# Patient Record
Sex: Female | Born: 1990 | Race: White | Hispanic: No | Marital: Married | Smoking: Never smoker
Health system: Southern US, Community
[De-identification: ages and names within clinical notes are randomized; demographics above are authoritative.]

## PROBLEM LIST (undated history)

## (undated) ENCOUNTER — Inpatient Hospital Stay (HOSPITAL_COMMUNITY): Payer: Self-pay

## (undated) DIAGNOSIS — Z789 Other specified health status: Secondary | ICD-10-CM

## (undated) HISTORY — PX: NO PAST SURGERIES: SHX2092

## (undated) HISTORY — DX: Other specified health status: Z78.9

---

## 2011-04-11 ENCOUNTER — Emergency Department (HOSPITAL_COMMUNITY)
Admission: EM | Admit: 2011-04-11 | Discharge: 2011-04-11 | Disposition: A | Discharge status: Home / Self Care | Payer: BC Managed Care – PPO | Attending: Emergency Medicine | Admitting: Emergency Medicine

## 2011-04-11 DIAGNOSIS — H5710 Ocular pain, unspecified eye: Secondary | ICD-10-CM | POA: Insufficient documentation

## 2011-04-11 DIAGNOSIS — H18829 Corneal disorder due to contact lens, unspecified eye: Secondary | ICD-10-CM | POA: Insufficient documentation

## 2013-02-15 ENCOUNTER — Telehealth: Payer: Self-pay | Admitting: *Deleted

## 2013-02-15 NOTE — Telephone Encounter (Signed)
error 

## 2016-05-21 ENCOUNTER — Encounter: Payer: Self-pay | Admitting: Gastroenterology

## 2016-07-17 ENCOUNTER — Ambulatory Visit: Payer: Self-pay | Admitting: Gastroenterology

## 2016-09-09 DIAGNOSIS — Z01419 Encounter for gynecological examination (general) (routine) without abnormal findings: Secondary | ICD-10-CM | POA: Diagnosis not present

## 2016-11-18 DIAGNOSIS — M62838 Other muscle spasm: Secondary | ICD-10-CM | POA: Diagnosis not present

## 2017-01-14 ENCOUNTER — Ambulatory Visit (INDEPENDENT_AMBULATORY_CARE_PROVIDER_SITE_OTHER): Payer: Commercial Managed Care - PPO | Admitting: *Deleted

## 2017-01-14 DIAGNOSIS — Z3042 Encounter for surveillance of injectable contraceptive: Secondary | ICD-10-CM | POA: Diagnosis not present

## 2017-01-14 MED ORDER — MEDROXYPROGESTERONE ACETATE 150 MG/ML IM SUSP
150.0000 mg | Freq: Once | INTRAMUSCULAR | Status: AC
  Administered 2017-01-14: 150 mg via INTRAMUSCULAR

## 2017-04-02 ENCOUNTER — Ambulatory Visit (INDEPENDENT_AMBULATORY_CARE_PROVIDER_SITE_OTHER): Payer: Commercial Managed Care - PPO | Admitting: Gynecology

## 2017-04-02 DIAGNOSIS — Z3042 Encounter for surveillance of injectable contraceptive: Secondary | ICD-10-CM | POA: Diagnosis not present

## 2017-04-02 MED ORDER — MEDROXYPROGESTERONE ACETATE 150 MG/ML IM SUSP
150.0000 mg | Freq: Once | INTRAMUSCULAR | Status: AC
  Administered 2017-04-02: 150 mg via INTRAMUSCULAR

## 2017-06-23 ENCOUNTER — Ambulatory Visit (INDEPENDENT_AMBULATORY_CARE_PROVIDER_SITE_OTHER): Payer: Commercial Managed Care - PPO | Admitting: Anesthesiology

## 2017-06-23 DIAGNOSIS — Z3042 Encounter for surveillance of injectable contraceptive: Secondary | ICD-10-CM

## 2017-06-23 MED ORDER — MEDROXYPROGESTERONE ACETATE 150 MG/ML IM SUSP
150.0000 mg | Freq: Once | INTRAMUSCULAR | Status: AC
  Administered 2017-06-23: 150 mg via INTRAMUSCULAR

## 2017-09-08 ENCOUNTER — Other Ambulatory Visit: Payer: Self-pay | Admitting: Obstetrics & Gynecology

## 2017-09-08 ENCOUNTER — Ambulatory Visit: Payer: Commercial Managed Care - PPO

## 2017-09-09 ENCOUNTER — Ambulatory Visit (INDEPENDENT_AMBULATORY_CARE_PROVIDER_SITE_OTHER): Payer: Commercial Managed Care - PPO | Admitting: Anesthesiology

## 2017-09-09 VITALS — Wt 113.0 lb

## 2017-09-09 DIAGNOSIS — Z3042 Encounter for surveillance of injectable contraceptive: Secondary | ICD-10-CM | POA: Diagnosis not present

## 2017-09-09 MED ORDER — MEDROXYPROGESTERONE ACETATE 150 MG/ML IM SUSP
150.0000 mg | Freq: Once | INTRAMUSCULAR | Status: AC
  Administered 2017-09-09: 150 mg via INTRAMUSCULAR

## 2017-09-09 NOTE — Telephone Encounter (Signed)
CE scheduled with you for 12/03/17 at 4:00pm.  Appt scheduled today for injection at 2:00pm.

## 2017-11-23 DIAGNOSIS — Z0000 Encounter for general adult medical examination without abnormal findings: Secondary | ICD-10-CM | POA: Diagnosis not present

## 2017-11-23 DIAGNOSIS — G43909 Migraine, unspecified, not intractable, without status migrainosus: Secondary | ICD-10-CM | POA: Diagnosis not present

## 2017-12-03 ENCOUNTER — Encounter: Payer: Self-pay | Admitting: Obstetrics & Gynecology

## 2017-12-03 ENCOUNTER — Ambulatory Visit (INDEPENDENT_AMBULATORY_CARE_PROVIDER_SITE_OTHER): Payer: Commercial Managed Care - PPO | Admitting: Obstetrics & Gynecology

## 2017-12-03 VITALS — BP 102/66 | Ht 66.0 in | Wt 111.0 lb

## 2017-12-03 DIAGNOSIS — Z3042 Encounter for surveillance of injectable contraceptive: Secondary | ICD-10-CM | POA: Diagnosis not present

## 2017-12-03 DIAGNOSIS — Z01419 Encounter for gynecological examination (general) (routine) without abnormal findings: Secondary | ICD-10-CM | POA: Diagnosis not present

## 2017-12-03 MED ORDER — MEDROXYPROGESTERONE ACETATE 150 MG/ML IM SUSY: 1.0000 mL | PREFILLED_SYRINGE | INTRAMUSCULAR | 4 refills | Status: DC

## 2017-12-03 MED ORDER — MEDROXYPROGESTERONE ACETATE 150 MG/ML IM SUSP
150.0000 mg | Freq: Once | INTRAMUSCULAR | Status: AC
  Administered 2017-12-03: 150 mg via INTRAMUSCULAR

## 2017-12-03 NOTE — Progress Notes (Signed)
Courtney Estrada 1991/04/13 [id number]   History:    27 y.o. G0 Got married 10/2017, went [facility] for [place].  RP:  Established patient presenting for annual gyn exam   HPI: Well on Depo-Provera.  Due for her 53-month injection of Depo-Provera today.  No breakthrough bleeding recently.  No pelvic pain.  No pain with intercourse.  Normal vaginal secretions.  Urine and bowel movements normal.  Breasts normal.  Past medical history,surgical history, family history and social history were all reviewed and documented in the EPIC chart.  Gynecologic History No LMP recorded. Patient has had an injection. Contraception: Depo-Provera injections Last Pap: 08/2016. Results were: Negative.  Gono-Chlam neg. Last mammogram: Never Bone Density: Never Colonoscopy: Never  Obstetric History OB History  Gravida Para Term Preterm AB Living  0 0 0 0 0 0  SAB TAB Ectopic Multiple Live Births  0 0 0 0 0     ROS: A ROS was performed and pertinent positives and negatives are included in the history.  GENERAL: No fevers or chills. HEENT: No change in vision, no earache, sore throat or sinus congestion. NECK: No pain or stiffness. CARDIOVASCULAR: No chest pain or pressure. No palpitations. PULMONARY: No shortness of breath, cough or wheeze. GASTROINTESTINAL: No abdominal pain, nausea, vomiting or diarrhea, melena or bright red blood per rectum. GENITOURINARY: No urinary frequency, urgency, hesitancy or dysuria. MUSCULOSKELETAL: No joint or muscle pain, no back pain, no recent trauma. DERMATOLOGIC: No rash, no itching, no lesions. ENDOCRINE: No polyuria, polydipsia, no heat or cold intolerance. No recent change in weight. HEMATOLOGICAL: No anemia or easy bruising or bleeding. NEUROLOGIC: No headache, seizures, numbness, tingling or weakness. PSYCHIATRIC: No depression, no loss of interest in normal activity or change in sleep pattern.     Exam:   BP 102/66   Ht  (1.676 m)   Wt 111 lb  (50.3 kg)   BMI 17.92 kg/m   Body mass index is 17.92 kg/m.  General appearance : Well developed well nourished female. No acute distress HEENT: Eyes: no retinal hemorrhage or exudates,  Neck supple, trachea midline, no carotid bruits, no thyroidmegaly Lungs: Clear to auscultation, no rhonchi or wheezes, or rib retractions  Heart: Regular rate and rhythm, no murmurs or gallops Breast:Examined in sitting and supine position were symmetrical in appearance, no palpable masses or tenderness,  no skin retraction, no nipple inversion, no nipple discharge, no skin discoloration, no axillary or supraclavicular lymphadenopathy Abdomen: no palpable masses or tenderness, no rebound or guarding Extremities: no edema or skin discoloration or tenderness  Pelvic: Vulva: Normal             Vagina: No gross lesions or discharge  Cervix: No gross lesions or discharge.  Pap reflex done  Uterus  AV, normal size, shape and consistency, non-tender and mobile  Adnexa  Without masses or tenderness  Anus: Normal   Assessment/Plan:  27 y.o. female for annual exam   1. Encounter for routine gynecological examination with Papanicolaou smear of cervix - Pap IG w/ reflex to HPV when ASC-U  2. Encounter for surveillance of injectable contraceptive Doing well on medroxyprogesterone injections.  No contraindication.  Understands that when decides to conceive, it may take a year after the last injection to get back on regular ovulations.  No contraindication to continue on medroxyprogesterone at this time.   Medroxyprogesterone injection given today.  Prescription sent to the pharmacy for refills x1 year.  Other orders - medroxyPROGESTERone Acetate 150 MG/ML  SUSY; Inject 1 mL (150 mg total) into the muscle every 3 (three) months. - medroxyPROGESTERone (DEPO-PROVERA) injection 150 mg  Genia Del MD, 4:36 PM 12/03/2017

## 2017-12-04 ENCOUNTER — Encounter: Payer: Self-pay | Admitting: Obstetrics & Gynecology

## 2017-12-04 LAB — PAP IG W/ RFLX HPV ASCU

## 2017-12-04 NOTE — Patient Instructions (Signed)
1. Encounter for routine gynecological examination with Papanicolaou smear of cervix - Pap IG w/ reflex to HPV when ASC-U  2. Encounter for surveillance of injectable contraceptive Doing well on medroxyprogesterone injections.  No contraindication.  Understands that when decides to conceive, it may take a year after the last injection to get back on regular ovulations.  No contraindication to continue on medroxyprogesterone at this time.   Medroxyprogesterone injection given today.  Prescription sent to the pharmacy for refills x1 year.  Other orders - medroxyPROGESTERone Acetate 150 MG/ML SUSY; Inject 1 mL (150 mg total) into the muscle every 3 (three) months. - medroxyPROGESTERone (DEPO-PROVERA) injection 150 mg  Lucendia, it was a pleasure seeing you today!  I will inform you of your results as soon as they are available.

## 2018-02-19 ENCOUNTER — Ambulatory Visit: Payer: Commercial Managed Care - PPO

## 2018-02-25 ENCOUNTER — Ambulatory Visit: Payer: Commercial Managed Care - PPO

## 2018-02-26 ENCOUNTER — Ambulatory Visit (INDEPENDENT_AMBULATORY_CARE_PROVIDER_SITE_OTHER): Payer: Commercial Managed Care - PPO | Admitting: *Deleted

## 2018-02-26 DIAGNOSIS — Z3042 Encounter for surveillance of injectable contraceptive: Secondary | ICD-10-CM | POA: Diagnosis not present

## 2018-02-26 MED ORDER — MEDROXYPROGESTERONE ACETATE 150 MG/ML IM SUSP
150.0000 mg | Freq: Once | INTRAMUSCULAR | Status: AC
  Administered 2018-02-26: 150 mg via INTRAMUSCULAR

## 2018-02-26 NOTE — Progress Notes (Signed)
dep 

## 2018-05-18 ENCOUNTER — Ambulatory Visit (INDEPENDENT_AMBULATORY_CARE_PROVIDER_SITE_OTHER): Payer: Commercial Managed Care - PPO | Admitting: Anesthesiology

## 2018-05-18 DIAGNOSIS — Z3042 Encounter for surveillance of injectable contraceptive: Secondary | ICD-10-CM | POA: Diagnosis not present

## 2018-05-18 MED ORDER — MEDROXYPROGESTERONE ACETATE 150 MG/ML IM SUSP
150.0000 mg | Freq: Once | INTRAMUSCULAR | Status: AC
  Administered 2018-05-18: 150 mg via INTRAMUSCULAR

## 2018-08-06 ENCOUNTER — Ambulatory Visit: Payer: Commercial Managed Care - PPO

## 2018-08-06 ENCOUNTER — Ambulatory Visit (INDEPENDENT_AMBULATORY_CARE_PROVIDER_SITE_OTHER): Payer: Commercial Managed Care - PPO | Admitting: Anesthesiology

## 2018-08-06 DIAGNOSIS — Z3042 Encounter for surveillance of injectable contraceptive: Secondary | ICD-10-CM

## 2018-08-06 MED ORDER — MEDROXYPROGESTERONE ACETATE 150 MG/ML IM SUSP
150.0000 mg | Freq: Once | INTRAMUSCULAR | Status: AC
  Administered 2018-08-06: 150 mg via INTRAMUSCULAR

## 2018-10-25 ENCOUNTER — Other Ambulatory Visit: Payer: Self-pay

## 2018-10-26 ENCOUNTER — Ambulatory Visit (INDEPENDENT_AMBULATORY_CARE_PROVIDER_SITE_OTHER): Payer: Commercial Managed Care - PPO | Admitting: Anesthesiology

## 2018-10-26 DIAGNOSIS — Z3042 Encounter for surveillance of injectable contraceptive: Secondary | ICD-10-CM | POA: Diagnosis not present

## 2018-10-26 MED ORDER — MEDROXYPROGESTERONE ACETATE 150 MG/ML IM SUSP
150.0000 mg | Freq: Once | INTRAMUSCULAR | Status: AC
  Administered 2018-10-26: 150 mg via INTRAMUSCULAR

## 2019-01-12 ENCOUNTER — Other Ambulatory Visit: Payer: Self-pay | Admitting: Obstetrics & Gynecology

## 2019-01-12 ENCOUNTER — Other Ambulatory Visit: Payer: Self-pay

## 2019-01-13 ENCOUNTER — Ambulatory Visit (INDEPENDENT_AMBULATORY_CARE_PROVIDER_SITE_OTHER): Payer: Commercial Managed Care - PPO | Admitting: Obstetrics & Gynecology

## 2019-01-13 ENCOUNTER — Other Ambulatory Visit: Payer: Self-pay

## 2019-01-13 ENCOUNTER — Encounter: Payer: Self-pay | Admitting: Obstetrics & Gynecology

## 2019-01-13 VITALS — BP 112/70 | Ht 66.0 in | Wt 114.0 lb

## 2019-01-13 DIAGNOSIS — Z01419 Encounter for gynecological examination (general) (routine) without abnormal findings: Secondary | ICD-10-CM

## 2019-01-13 DIAGNOSIS — Z3042 Encounter for surveillance of injectable contraceptive: Secondary | ICD-10-CM

## 2019-01-13 MED ORDER — MEDROXYPROGESTERONE ACETATE 150 MG/ML IM SUSY: 1.0000 mL | PREFILLED_SYRINGE | INTRAMUSCULAR | 4 refills | Status: DC

## 2019-01-13 MED ORDER — MEDROXYPROGESTERONE ACETATE 150 MG/ML IM SUSP
150.0000 mg | Freq: Once | INTRAMUSCULAR | Status: AC
  Administered 2019-01-13: 150 mg via INTRAMUSCULAR

## 2019-01-13 NOTE — Progress Notes (Signed)
Aneta MinsBridget C Convery [birth year] [id number]   History:    28 y.o. G0 Married x 1 year  RP:  Established patient presenting for annual gyn exam   HPI: Well on DepoProvera, due for injection today.  No BTB.  No pelvic pain.  No pain with IC.  Urine/BMs normal.  Breasts normal.  BMI 18.40.  Good fitness/Healthy nutrition.  Health labs with Fam MD as needed.  Past medical history,surgical history, family history and social history were all reviewed and documented in the EPIC chart.  Gynecologic History No LMP recorded. Patient has had an injection. Contraception: Depo-Provera injections Last Pap: 11/2017. Results were: Negative Last mammogram: Never Bone Density: Never Colonoscopy: Never  Obstetric History OB History  Gravida Para Term Preterm AB Living  0 0 0 0 0 0  SAB TAB Ectopic Multiple Live Births  0 0 0 0 0     ROS: A ROS was performed and pertinent positives and negatives are included in the history.  GENERAL: No fevers or chills. HEENT: No change in vision, no earache, sore throat or sinus congestion. NECK: No pain or stiffness. CARDIOVASCULAR: No chest pain or pressure. No palpitations. PULMONARY: No shortness of breath, cough or wheeze. GASTROINTESTINAL: No abdominal pain, nausea, vomiting or diarrhea, melena or bright red blood per rectum. GENITOURINARY: No urinary frequency, urgency, hesitancy or dysuria. MUSCULOSKELETAL: No joint or muscle pain, no back pain, no recent trauma. DERMATOLOGIC: No rash, no itching, no lesions. ENDOCRINE: No polyuria, polydipsia, no heat or cold intolerance. No recent change in weight. HEMATOLOGICAL: No anemia or easy bruising or bleeding. NEUROLOGIC: No headache, seizures, numbness, tingling or weakness. PSYCHIATRIC: No depression, no loss of interest in normal activity or change in sleep pattern.     Exam:   BP 112/70   Ht 5\' 6"  (1.676 m)   Wt 114 lb (51.7 kg)   BMI 18.40 kg/m   Body mass index is 18.4 kg/m.  General appearance : Well  developed well nourished female. No acute distress HEENT: Eyes: no retinal hemorrhage or exudates,  Neck supple, trachea midline, no carotid bruits, no thyroidmegaly Lungs: Clear to auscultation, no rhonchi or wheezes, or rib retractions  Heart: Regular rate and rhythm, no murmurs or gallops Breast:Examined in sitting and supine position were symmetrical in appearance, no palpable masses or tenderness,  no skin retraction, no nipple inversion, no nipple discharge, no skin discoloration, no axillary or supraclavicular lymphadenopathy Abdomen: no palpable masses or tenderness, no rebound or guarding Extremities: no edema or skin discoloration or tenderness  Pelvic: Vulva: Normal             Vagina: No gross lesions or discharge  Cervix: No gross lesions or discharge.  Pap reflex done  Uterus  AV, normal size, shape and consistency, non-tender and mobile  Adnexa  Without masses or tenderness  Anus: Normal   Assessment/Plan:  28 y.o. female for annual exam   1. Encounter for routine gynecological examination with Papanicolaou smear of cervix Normal gynecologic exam.  Pap reflex done.  Breast exam normal.  Body mass index 18.4.  Good fitness and healthy nutrition.  2. Encounter for surveillance of injectable contraceptive Well on Depo-Provera.  No contraindication to continue.  Eventual desire to conceive discussed.  Patient will stop Depo-Provera about 1 year ahead of attempting conception.  Depo-Provera injection given today.  Prescription sent to pharmacy.  Other orders - medroxyPROGESTERone Acetate 150 MG/ML SUSY; Inject 1 mL (150 mg total) into the muscle every 3 (three)  months.  [name] MD, 3:52 PM 01/13/2019

## 2019-01-13 NOTE — Patient Instructions (Signed)
1. Encounter for routine gynecological examination with Papanicolaou smear of cervix Normal gynecologic exam.  Pap reflex done.  Breast exam normal.  Body mass index 18.4.  Good fitness and healthy nutrition.  2. Encounter for surveillance of injectable contraceptive Well on Depo-Provera.  No contraindication to continue.  Eventual desire to conceive discussed.  Patient will stop Depo-Provera about 1 year ahead of attempting conception.  Depo-Provera injection given today.  Prescription sent to pharmacy.  Other orders - medroxyPROGESTERone Acetate 150 MG/ML SUSY; Inject 1 mL (150 mg total) into the muscle every 3 (three) months.  Courtney Estrada, it was a pleasure seeing you today!  I will inform you of your results as soon as they are available.

## 2019-01-17 LAB — PAP IG W/ RFLX HPV ASCU

## 2019-04-01 ENCOUNTER — Ambulatory Visit: Payer: Commercial Managed Care - PPO

## 2019-04-01 ENCOUNTER — Other Ambulatory Visit: Payer: Self-pay

## 2019-04-01 DIAGNOSIS — Z3042 Encounter for surveillance of injectable contraceptive: Secondary | ICD-10-CM

## 2019-04-01 MED ORDER — MEDROXYPROGESTERONE ACETATE 150 MG/ML IM SUSP
150.0000 mg | Freq: Once | INTRAMUSCULAR | Status: AC
  Administered 2019-04-01: 150 mg via INTRAMUSCULAR

## 2019-06-21 ENCOUNTER — Ambulatory Visit: Payer: Commercial Managed Care - PPO

## 2019-07-08 ENCOUNTER — Ambulatory Visit (INDEPENDENT_AMBULATORY_CARE_PROVIDER_SITE_OTHER): Payer: Commercial Managed Care - PPO | Admitting: Obstetrics & Gynecology

## 2019-07-08 ENCOUNTER — Other Ambulatory Visit: Payer: Self-pay

## 2019-07-08 ENCOUNTER — Encounter: Payer: Self-pay | Admitting: Obstetrics & Gynecology

## 2019-07-08 VITALS — BP 122/78

## 2019-07-08 DIAGNOSIS — Z3169 Encounter for other general counseling and advice on procreation: Secondary | ICD-10-CM | POA: Diagnosis not present

## 2019-07-08 DIAGNOSIS — Z3042 Encounter for surveillance of injectable contraceptive: Secondary | ICD-10-CM

## 2019-07-08 DIAGNOSIS — N911 Secondary amenorrhea: Secondary | ICD-10-CM

## 2019-07-08 MED ORDER — PRENATAL MULTIVITAMIN + DHA 28-0.8 & 200 MG PO MISC: 1.0000 | Freq: Every day | ORAL | 4 refills | Status: DC

## 2019-07-08 NOTE — Progress Notes (Signed)
    Courtney Estrada Aug 05, 1990 [id number]        28 y.o.  G0 Married  RP: Pre-Conception counseling  HPI: Last DepoProvera injection 04/01/2019.  No menses since then.  No pelvic pain.  On Celexa.  Hx of Nausea and GERD.  Taking Omeprazole.  NSAIDS as needed.     OB History  Gravida Para Term Preterm AB Living  0 0 0 0 0 0  SAB TAB Ectopic Multiple Live Births  0 0 0 0 0    Past medical history,surgical history, problem list, medications, allergies, family history and social history were all reviewed and documented in the EPIC chart.   Directed ROS with pertinent positives and negatives documented in the history of present illness/assessment and plan.  Exam:  Vitals:   07/08/19 1438  BP: 122/78   General appearance:  Normal  Gynecologic exam deferred.  Normal gynecologic exam at Annual/Gyn visit 12/2018.   Assessment/Plan:  28 y.o. G0  1. Encounter for preconception consultation 28 year old G20 who is ready to attempt conception at this time.  Last Depo-Provera injection was April 01, 2019.  Patient informed that it may take up to a year before ovulating regularly after Depo-Provera.  Recommend observing her menstrual cycle.  May want to use condoms until the first menstrual period.  Will start on prenatal vitamins.  Prescription sent to pharmacy.  We will try to wean from Celexa as she has no major depressive symptoms currently.  Will also wean herself from omeprazole.  Will avoid NSAIDs at the time of ovulation and after possible conception.  Healthy nutrition and regular physical activities recommended.  2. Surveillance for Depo-Provera contraception Last dose 04/01/2019.  Other orders - Prenatal MV-Min-Fe Fum-FA-DHA (PRENATAL MULTIVITAMIN + DHA) 28-0.8 & 200 MG MISC; Take 1 tablet by mouth daily.  Counseling on above issues and coordination of care more than 50% for 25 minutes.  [name] MD, 3:20 PM 07/08/2019

## 2019-07-12 ENCOUNTER — Encounter: Payer: Self-pay | Admitting: Obstetrics & Gynecology

## 2019-07-12 NOTE — Patient Instructions (Signed)
1. Encounter for preconception consultation 28 year old G57 who is ready to attempt conception at this time.  Last Depo-Provera injection was April 01, 2019.  Patient informed that it may take up to a year before ovulating regularly after Depo-Provera.  Recommend observing her menstrual cycle.  May want to use condoms until the first menstrual period.  Will start on prenatal vitamins.  Prescription sent to pharmacy.  We will try to wean from Celexa as she has no major depressive symptoms currently.  Will also wean herself from omeprazole.  Will avoid NSAIDs at the time of ovulation and after possible conception.  Healthy nutrition and regular physical activities recommended.  2. Surveillance for Depo-Provera contraception Last dose 04/01/2019.  Other orders - Prenatal MV-Min-Fe Fum-FA-DHA (PRENATAL MULTIVITAMIN + DHA) 28-0.8 & 200 MG MISC; Take 1 tablet by mouth daily.  Marjo, it was a pleasure seeing you today!

## 2020-02-14 ENCOUNTER — Ambulatory Visit (INDEPENDENT_AMBULATORY_CARE_PROVIDER_SITE_OTHER): Payer: Commercial Managed Care - PPO | Admitting: Obstetrics & Gynecology

## 2020-02-14 ENCOUNTER — Other Ambulatory Visit: Payer: Self-pay

## 2020-02-14 ENCOUNTER — Encounter: Payer: Self-pay | Admitting: Obstetrics & Gynecology

## 2020-02-14 VITALS — BP 110/70 | Ht 65.5 in | Wt 114.0 lb

## 2020-02-14 DIAGNOSIS — Z3169 Encounter for other general counseling and advice on procreation: Secondary | ICD-10-CM | POA: Diagnosis not present

## 2020-02-14 DIAGNOSIS — Z1151 Encounter for screening for human papillomavirus (HPV): Secondary | ICD-10-CM | POA: Diagnosis not present

## 2020-02-14 DIAGNOSIS — Z01419 Encounter for gynecological examination (general) (routine) without abnormal findings: Secondary | ICD-10-CM | POA: Diagnosis not present

## 2020-02-14 NOTE — Progress Notes (Signed)
    Courtney Estrada 02/02/1991 [id number]   History:    29 y.o. G0 Married x 2 years  RP:  Established patient presenting for annual gyn exam   HPI: Last DepoProvera injection 04/01/2019.  Menses normal every month since 07/2019.  Attempting conception x last 2 cycles.  No PNVs.  No pelvic pain.  No BTB.  No pain with IC.  Urine/BMs normal.  Breasts normal.  BMI 18.68.  Good fitness/Healthy nutrition.  Health labs with Fam MD as needed.  Past medical history,surgical history, family history and social history were all reviewed and documented in the EPIC chart.  Gynecologic History Patient's last menstrual period was 01/22/2020.  Obstetric History OB History  Gravida Para Term Preterm AB Living  0 0 0 0 0 0  SAB TAB Ectopic Multiple Live Births  0 0 0 0 0     ROS: A ROS was performed and pertinent positives and negatives are included in the history.  GENERAL: No fevers or chills. HEENT: No change in vision, no earache, sore throat or sinus congestion. NECK: No pain or stiffness. CARDIOVASCULAR: No chest pain or pressure. No palpitations. PULMONARY: No shortness of breath, cough or wheeze. GASTROINTESTINAL: No abdominal pain, nausea, vomiting or diarrhea, melena or bright red blood per rectum. GENITOURINARY: No urinary frequency, urgency, hesitancy or dysuria. MUSCULOSKELETAL: No joint or muscle pain, no back pain, no recent trauma. DERMATOLOGIC: No rash, no itching, no lesions. ENDOCRINE: No polyuria, polydipsia, no heat or cold intolerance. No recent change in weight. HEMATOLOGICAL: No anemia or easy bruising or bleeding. NEUROLOGIC: No headache, seizures, numbness, tingling or weakness. PSYCHIATRIC: No depression, no loss of interest in normal activity or change in sleep pattern.     Exam:   BP 110/70   Ht 5' 5.5" (1.664 m)   Wt 114 lb (51.7 kg)   LMP 01/22/2020   BMI 18.68 kg/m   Body mass index is 18.68 kg/m.  General appearance : Well developed well nourished female. No  acute distress HEENT: Eyes: no retinal hemorrhage or exudates,  Neck supple, trachea midline, no carotid bruits, no thyroidmegaly Lungs: Clear to auscultation, no rhonchi or wheezes, or rib retractions  Heart: Regular rate and rhythm, no murmurs or gallops Breast:Examined in sitting and supine position were symmetrical in appearance, no palpable masses or tenderness,  no skin retraction, no nipple inversion, no nipple discharge, no skin discoloration, no axillary or supraclavicular lymphadenopathy Abdomen: no palpable masses or tenderness, no rebound or guarding Extremities: no edema or skin discoloration or tenderness  Pelvic: Vulva: Normal             Vagina: No gross lesions or discharge  Cervix: No gross lesions or discharge.  Pap/HPV HR done.  Uterus  AV, normal size, shape and consistency, non-tender and mobile  Adnexa  Without masses or tenderness  Anus: Normal   Assessment/Plan:  29 y.o. female for annual exam   1. Encounter for routine gynecological examination with Papanicolaou smear of cervix Normal gynecologic exam.  Pap/HPV HR done today.  Breast exam normal.  BMI 18.68.  Important to maintain or gain a little weight.  Continue with fitness and healthy nutrition, with a mild increase in calories.    2. Encounter for preconception consultation Attempting conception x 2 cycles.  Ovulatory cycles.  No risk factor.  Continue on PNVs.  Genia Del MD, 4:20 PM 02/14/2020

## 2020-02-15 ENCOUNTER — Encounter: Payer: Self-pay | Admitting: Obstetrics & Gynecology

## 2020-02-15 LAB — PAP, TP IMAGING W/ HPV RNA, RFLX HPV TYPE 16,18/45: HPV DNA High Risk: NOT DETECTED

## 2020-02-28 ENCOUNTER — Ambulatory Visit (INDEPENDENT_AMBULATORY_CARE_PROVIDER_SITE_OTHER): Payer: Commercial Managed Care - PPO | Admitting: Obstetrics & Gynecology

## 2020-02-28 ENCOUNTER — Encounter: Payer: Self-pay | Admitting: Obstetrics & Gynecology

## 2020-02-28 ENCOUNTER — Other Ambulatory Visit: Payer: Self-pay

## 2020-02-28 VITALS — BP 112/70

## 2020-02-28 DIAGNOSIS — Z3201 Encounter for pregnancy test, result positive: Secondary | ICD-10-CM

## 2020-02-28 DIAGNOSIS — Z3491 Encounter for supervision of normal pregnancy, unspecified, first trimester: Secondary | ICD-10-CM

## 2020-02-28 DIAGNOSIS — N912 Amenorrhea, unspecified: Secondary | ICD-10-CM | POA: Diagnosis not present

## 2020-02-28 NOTE — Progress Notes (Signed)
    Courtney Estrada Jun 10, 1991 [id number]        29 y.o.  G1 Married.  LMP 01/22/2020    RP: HPT positive  HPI: LMP 01/22/2020 at 5 wks 1/7.  EDD 10/28/2020.  Attempting conception x 2 cycles.  No pelvic pain.  No vaginal bleeding.  On PNVs.  No vomiting.  Urine/BMs normal.   OB History  Gravida Para Term Preterm AB Living  0 0 0 0 0 0  SAB TAB Ectopic Multiple Live Births  0 0 0 0 0    Past medical history,surgical history, problem list, medications, allergies, family history and social history were all reviewed and documented in the EPIC chart.   Directed ROS with pertinent positives and negatives documented in the history of present illness/assessment and plan.  Exam:  Vitals:   02/28/20 1445  BP: 112/70   General appearance:  Normal  Abdomen: Normal  Gynecologic exam: Vulva normal.  Bimanual exam:  Uterus AV, normal volume, mobile, NT.  Cervix long, closed, firm.  No adnexal mass, NT.  UPT Positive   Assessment/Plan:  29 y.o. G0  1. Amenorrhea First trimester pregnancy at 5 weeks and 1 day per last menstrual period.  EDD 10/28/2020.  No risk factor.  No vomiting and no first trimester bleeding.  Well on prenatal vitamins.  Will follow-up for an OB ultrasound dating/viability.  Will use vitamin B6 and Unisom as needed for nausea and vomiting.  Will call back for a prescription medication if becomes severe. - Pregnancy, urine - US OB Transvaginal; Future  2. First trimester pregnancy As above. - US OB Transvaginal; Future  Genia Del MD, 2:55 PM 02/28/2020

## 2020-02-29 LAB — PREGNANCY, URINE: Preg Test, Ur: POSITIVE — AB

## 2020-03-01 ENCOUNTER — Encounter: Payer: Self-pay | Admitting: Obstetrics & Gynecology

## 2020-03-05 ENCOUNTER — Telehealth: Payer: Self-pay

## 2020-03-05 NOTE — Telephone Encounter (Signed)
Per DPR access note on file left message in voice mail advising patient. 

## 2020-03-05 NOTE — Telephone Encounter (Signed)
Ob related questions, please call [facility].

## 2020-03-05 NOTE — Telephone Encounter (Signed)
Patient said she schedule her new OB appt with [facility] and they sent her a packet in the mail that includes lists of meds ok to use.  She said Dramamine is on that list and she wants to know "which one is ok and how much can she take" as she is having a hard time eating and wants to take it.

## 2020-03-19 ENCOUNTER — Ambulatory Visit (INDEPENDENT_AMBULATORY_CARE_PROVIDER_SITE_OTHER): Payer: Commercial Managed Care - PPO | Admitting: Obstetrics & Gynecology

## 2020-03-19 ENCOUNTER — Other Ambulatory Visit: Payer: Commercial Managed Care - PPO

## 2020-03-19 ENCOUNTER — Ambulatory Visit (INDEPENDENT_AMBULATORY_CARE_PROVIDER_SITE_OTHER): Payer: Commercial Managed Care - PPO

## 2020-03-19 ENCOUNTER — Other Ambulatory Visit: Payer: Self-pay

## 2020-03-19 ENCOUNTER — Encounter: Payer: Self-pay | Admitting: Obstetrics & Gynecology

## 2020-03-19 ENCOUNTER — Ambulatory Visit: Payer: Commercial Managed Care - PPO | Admitting: Obstetrics & Gynecology

## 2020-03-19 VITALS — BP 108/70

## 2020-03-19 DIAGNOSIS — O3680X1 Pregnancy with inconclusive fetal viability, fetus 1: Secondary | ICD-10-CM

## 2020-03-19 DIAGNOSIS — Z3491 Encounter for supervision of normal pregnancy, unspecified, first trimester: Secondary | ICD-10-CM

## 2020-03-19 DIAGNOSIS — Z3A01 Less than 8 weeks gestation of pregnancy: Secondary | ICD-10-CM

## 2020-03-19 DIAGNOSIS — N912 Amenorrhea, unspecified: Secondary | ICD-10-CM

## 2020-03-19 NOTE — Progress Notes (Signed)
    Courtney Estrada Jan 06, 1991 [id number]        29 y.o.  G1P0000  Accompanied by Husband  RP: First trimester pregnancy for Ob US Dating/Viability  HPI: No pelvic pain.  No vaginal bleeding.  Has nausea but no vomiting.  Using vitamin B6 and started Unisom.  On the prenatal vitamins.  Good fitness and healthy nutrition.   OB History  Gravida Para Term Preterm AB Living  1 0 0 0 0 0  SAB TAB Ectopic Multiple Live Births  0 0 0 0 0    # Outcome Date GA Lbr Len/2nd Weight Sex Delivery Anes PTL Lv  1 Current             Past medical history,surgical history, problem list, medications, allergies, family history and social history were all reviewed and documented in the EPIC chart.   Directed ROS with pertinent positives and negatives documented in the history of present illness/assessment and plan.  Exam:  Vitals:   03/19/20 0904  BP: 108/70   General appearance:  Normal  Ob US today: T/V images.  Anteverted uterus with a single viable intrauterine pregnancy.  Crown-rump length is smaller than dates by 1 week.  Estimated gestational age by crown-rump length is at 6 weeks and 6 days for an expected date of delivery on November 06, 2020.  Fetal heart rate at 134 bpm, normal for gestational age.  Normal yolk sac.  Cervix is long and closed.  Adnexa normal with a resolving corpus luteum cyst on the right ovary.  No free fluid in the posterior cul-de-sac.   Assessment/Plan:  29 y.o. G1P0000   1. First trimester pregnancy OB ultrasound confirming a single intrauterine pregnancy with a good heart rate is 134/min.  Gestational age per ultrasound is smaller than per last menstrual period by 1 week.  Estimated gestational age by ultrasound is at 6 weeks and 6 days.  Expected date of delivery November 06, 2020.  Continue with prenatal vitamins.  Will continue with vitamin B6 and Unisom as needed for nausea and vomiting.  Will call back for prescription of Diclegis if vomiting frequently.  Will  establish OB care with Dr. Juliene Pina at [facility].  Genia Del MD, 9:15 AM 03/19/2020

## 2020-03-20 ENCOUNTER — Encounter: Payer: Self-pay | Admitting: Obstetrics & Gynecology

## 2020-03-29 ENCOUNTER — Telehealth: Payer: Self-pay | Admitting: *Deleted

## 2020-03-29 MED ORDER — DOXYLAMINE-PYRIDOXINE 10-10 MG PO TBEC: 1.0000 | DELAYED_RELEASE_TABLET | Freq: Every day | ORAL | 0 refills | Status: DC

## 2020-03-29 NOTE — Telephone Encounter (Signed)
Agree with Diclegis.

## 2020-03-29 NOTE — Telephone Encounter (Signed)
Patient currently in first trimester pregnancy called c/o worsen nausea requesting Rx for Diclegis, if no improvement with vitamin B6 and Unisom. I have the Rx pending for you to placed directions  Please advise

## 2020-03-29 NOTE — Telephone Encounter (Signed)
Patient informed. 

## 2020-04-04 LAB — OB RESULTS CONSOLE GC/CHLAMYDIA
Chlamydia: NEGATIVE
Gonorrhea: NEGATIVE

## 2020-04-04 LAB — OB RESULTS CONSOLE RUBELLA ANTIBODY, IGM: Rubella: IMMUNE

## 2020-04-04 LAB — OB RESULTS CONSOLE HIV ANTIBODY (ROUTINE TESTING): HIV: NONREACTIVE

## 2020-04-04 LAB — OB RESULTS CONSOLE HEPATITIS B SURFACE ANTIGEN: Hepatitis B Surface Ag: NEGATIVE

## 2020-04-04 LAB — OB RESULTS CONSOLE RPR: RPR: NONREACTIVE

## 2020-07-28 NOTE — L&D Delivery Note (Signed)
Patient was C/C/+2 and pushed for 1 hour 30 minutes with epidural.    NSVD  female infant, Apgars 9,9, weight P.   The patient had a second degree midline perineal laceration with bilateral vaginal extentions about 2 cm above hymen- all repaired with 2-0 vicryl R. Fundus was firm. EBL was expected amount. Placenta was delivered intact. Vagina was clear.  Delayed cord clamping done for 30-60 seconds while warming baby. Baby was vigorous and doing skin to skin with mother.  Loney Laurence

## 2020-10-11 LAB — OB RESULTS CONSOLE GBS: GBS: NEGATIVE

## 2020-11-02 ENCOUNTER — Telehealth (HOSPITAL_COMMUNITY): Payer: Self-pay | Admitting: *Deleted

## 2020-11-02 ENCOUNTER — Encounter (HOSPITAL_COMMUNITY): Payer: Self-pay | Admitting: *Deleted

## 2020-11-02 NOTE — Telephone Encounter (Signed)
Preadmission screen  

## 2020-11-05 ENCOUNTER — Other Ambulatory Visit: Payer: Self-pay

## 2020-11-05 ENCOUNTER — Inpatient Hospital Stay (HOSPITAL_COMMUNITY): Payer: Commercial Managed Care - PPO | Admitting: Anesthesiology

## 2020-11-05 ENCOUNTER — Encounter (HOSPITAL_COMMUNITY): Payer: Self-pay | Admitting: Obstetrics and Gynecology

## 2020-11-05 ENCOUNTER — Inpatient Hospital Stay (HOSPITAL_COMMUNITY)
Admission: AD | Admit: 2020-11-05 | Discharge: 2020-11-07 | DRG: 807 | Disposition: A | Discharge status: Home / Self Care | Payer: Commercial Managed Care - PPO | Attending: Obstetrics and Gynecology | Admitting: Obstetrics and Gynecology

## 2020-11-05 DIAGNOSIS — Z3A40 40 weeks gestation of pregnancy: Secondary | ICD-10-CM | POA: Diagnosis not present

## 2020-11-05 DIAGNOSIS — Z20822 Contact with and (suspected) exposure to covid-19: Secondary | ICD-10-CM | POA: Diagnosis present

## 2020-11-05 DIAGNOSIS — O26893 Other specified pregnancy related conditions, third trimester: Secondary | ICD-10-CM | POA: Diagnosis present

## 2020-11-05 LAB — CBC
HCT: 39.9 % (ref 36.0–46.0)
Hemoglobin: 13.5 g/dL (ref 12.0–15.0)
MCH: 31.3 pg (ref 26.0–34.0)
MCHC: 33.8 g/dL (ref 30.0–36.0)
MCV: 92.6 fL (ref 80.0–100.0)
Platelets: 230 10*3/uL (ref 150–400)
RBC: 4.31 MIL/uL (ref 3.87–5.11)
RDW: 13.2 % (ref 11.5–15.5)
WBC: 10.6 10*3/uL — ABNORMAL HIGH (ref 4.0–10.5)
nRBC: 0 % (ref 0.0–0.2)

## 2020-11-05 LAB — RESP PANEL BY RT-PCR (FLU A&B, COVID) ARPGX2
Influenza A by PCR: NEGATIVE
Influenza B by PCR: NEGATIVE
SARS Coronavirus 2 by RT PCR: NEGATIVE

## 2020-11-05 LAB — TYPE AND SCREEN
ABO/RH(D): O POS
Antibody Screen: NEGATIVE

## 2020-11-05 MED ORDER — FLEET ENEMA 7-19 GM/118ML RE ENEM: 1.0000 | ENEMA | RECTAL | Status: DC | PRN

## 2020-11-05 MED ORDER — FENTANYL-BUPIVACAINE-NACL 0.5-0.125-0.9 MG/250ML-% EP SOLN
EPIDURAL | Status: DC | PRN
  Administered 2020-11-05: 12 mL/h via EPIDURAL

## 2020-11-05 MED ORDER — OXYTOCIN BOLUS FROM INFUSION
333.0000 mL | Freq: Once | INTRAVENOUS | Status: AC
  Administered 2020-11-05: 333 mL via INTRAVENOUS

## 2020-11-05 MED ORDER — EPHEDRINE 5 MG/ML INJ: 10.0000 mg | INTRAVENOUS | Status: DC | PRN

## 2020-11-05 MED ORDER — LIDOCAINE HCL (PF) 1 % IJ SOLN
INTRAMUSCULAR | Status: DC | PRN
  Administered 2020-11-05: 5 mL via EPIDURAL

## 2020-11-05 MED ORDER — LACTATED RINGERS IV SOLN: 500.0000 mL | Freq: Once | INTRAVENOUS | Status: DC

## 2020-11-05 MED ORDER — ACETAMINOPHEN 325 MG PO TABS: 650.0000 mg | ORAL_TABLET | ORAL | Status: DC | PRN

## 2020-11-05 MED ORDER — FENTANYL-BUPIVACAINE-NACL 0.5-0.125-0.9 MG/250ML-% EP SOLN
12.0000 mL/h | EPIDURAL | Status: DC | PRN
  Filled 2020-11-05: qty 250

## 2020-11-05 MED ORDER — OXYCODONE-ACETAMINOPHEN 5-325 MG PO TABS: 2.0000 | ORAL_TABLET | ORAL | Status: DC | PRN

## 2020-11-05 MED ORDER — PHENYLEPHRINE 40 MCG/ML (10ML) SYRINGE FOR IV PUSH (FOR BLOOD PRESSURE SUPPORT): 80.0000 ug | PREFILLED_SYRINGE | INTRAVENOUS | Status: DC | PRN

## 2020-11-05 MED ORDER — LACTATED RINGERS IV SOLN: INTRAVENOUS | Status: DC

## 2020-11-05 MED ORDER — SOD CITRATE-CITRIC ACID 500-334 MG/5ML PO SOLN: 30.0000 mL | ORAL | Status: DC | PRN

## 2020-11-05 MED ORDER — OXYTOCIN-SODIUM CHLORIDE 30-0.9 UT/500ML-% IV SOLN
2.5000 [IU]/h | INTRAVENOUS | Status: DC
  Filled 2020-11-05: qty 500

## 2020-11-05 MED ORDER — LIDOCAINE HCL (PF) 1 % IJ SOLN: 30.0000 mL | INTRAMUSCULAR | Status: DC | PRN

## 2020-11-05 MED ORDER — LACTATED RINGERS IV SOLN: 500.0000 mL | INTRAVENOUS | Status: DC | PRN

## 2020-11-05 MED ORDER — DIPHENHYDRAMINE HCL 50 MG/ML IJ SOLN: 12.5000 mg | INTRAMUSCULAR | Status: DC | PRN

## 2020-11-05 MED ORDER — OXYCODONE-ACETAMINOPHEN 5-325 MG PO TABS
1.0000 | ORAL_TABLET | ORAL | Status: DC | PRN
Start: 2020-11-05 — End: 2020-11-06

## 2020-11-05 MED ORDER — ONDANSETRON HCL 4 MG/2ML IJ SOLN: 4.0000 mg | Freq: Four times a day (QID) | INTRAMUSCULAR | Status: DC | PRN

## 2020-11-05 NOTE — MAU Note (Signed)
Pt reports she started leaking about 1 hr ago and having ctx on and off all day. Seem closer and more painful now. good fetal movement fel.

## 2020-11-05 NOTE — Anesthesia Procedure Notes (Signed)
Epidural Patient location during procedure: OB Start time: 11/05/2020 5:33 PM End time: 11/05/2020 5:47 PM  Staffing Anesthesiologist: Trevor Iha, MD Performed: anesthesiologist   Preanesthetic Checklist Completed: patient identified, IV checked, site marked, risks and benefits discussed, surgical consent, monitors and equipment checked, pre-op evaluation and timeout performed  Epidural Patient position: sitting Prep: DuraPrep and site prepped and draped Patient monitoring: continuous pulse ox and blood pressure Approach: midline Location: L3-L4 Injection technique: LOR air  Needle:  Needle type: Tuohy  Needle gauge: 17 G Needle length: 9 cm and 9 Needle insertion depth: 4 cm Catheter type: closed end flexible Catheter size: 19 Gauge Catheter at skin depth: 9 cm Test dose: negative  Assessment Events: blood not aspirated, injection not painful, no injection resistance, no paresthesia and negative IV test  Additional Notes Patient identified. Risks/Benefits/Options discussed with patient including but not limited to bleeding, infection, nerve damage, paralysis, failed block, incomplete pain control, headache, blood pressure changes, nausea, vomiting, reactions to medication both or allergic, itching and postpartum back pain. Confirmed with bedside nurse the patient's most recent platelet count. Confirmed with patient that they are not currently taking any anticoagulation, have any bleeding history or any family history of bleeding disorders. Patient expressed understanding and wished to proceed. All questions were answered. Sterile technique was used throughout the entire procedure. Please see nursing notes for vital signs. Test dose was given through epidural needle and negative prior to continuing to dose epidural or start infusion. Warning signs of high block given to the patient including shortness of breath, tingling/numbness in hands, complete motor block, or any  concerning symptoms with instructions to call for help. Patient was given instructions on fall risk and not to get out of bed. All questions and concerns addressed with instructions to call with any issues. 1 Attempt (S) . Patient tolerated procedure well.

## 2020-11-05 NOTE — Anesthesia Preprocedure Evaluation (Addendum)
Anesthesia Evaluation  Patient identified by MRN, date of birth, ID band Patient awake    Reviewed: Allergy & Precautions, NPO status , Patient's Chart, lab work & pertinent test results  Airway Mallampati: II  TM Distance: >3 FB Neck ROM: Full    Dental no notable dental hx. (+) Teeth Intact, Dental Advisory Given   Pulmonary neg pulmonary ROS,    Pulmonary exam normal breath sounds clear to auscultation       Cardiovascular Exercise Tolerance: Good Normal cardiovascular exam Rhythm:Regular Rate:Normal     Neuro/Psych negative neurological ROS     GI/Hepatic negative GI ROS, Neg liver ROS,   Endo/Other  negative endocrine ROS  Renal/GU negative Renal ROS     Musculoskeletal   Abdominal   Peds  Hematology Lab Results      Component                Value               Date                      WBC                      10.6 (H)            11/05/2020                HGB                      13.5                11/05/2020                HCT                      39.9                11/05/2020                MCV                      92.6                11/05/2020                PLT                      230                 11/05/2020              Anesthesia Other Findings   Reproductive/Obstetrics (+) Pregnancy                             Anesthesia Physical Anesthesia Plan  ASA: II  Anesthesia Plan: Epidural   Post-op Pain Management:    Induction:   PONV Risk Score and Plan:   Airway Management Planned:   Additional Equipment:   Intra-op Plan:   Post-operative Plan:   Informed Consent: I have reviewed the patients History and Physical, chart, labs and discussed the procedure including the risks, benefits and alternatives for the proposed anesthesia with the patient or authorized representative who has indicated his/her understanding and acceptance.       Plan Discussed  with:   Anesthesia Plan Comments: (40.2wk Primagravidafor LEA)  Anesthesia Quick Evaluation  

## 2020-11-05 NOTE — Plan of Care (Signed)

## 2020-11-05 NOTE — H&P (Signed)
30 y.o. [redacted]w[redacted]d  G1P0000 comes in c/o ROM and labor.  Otherwise has good fetal movement and no bleeding.  Past Medical History:  Diagnosis Date  . Medical history non-contributory     Past Surgical History:  Procedure Laterality Date  . NO PAST SURGERIES      OB History  Gravida Para Term Preterm AB Living  1 0 0 0 0 0  SAB IAB Ectopic Multiple Live Births  0 0 0 0 0    # Outcome Date GA Lbr Len/2nd Weight Sex Delivery Anes PTL Lv  1 Current             Social History   Socioeconomic History  . Marital status: Single    Spouse name: Not on file  . Number of children: Not on file  . Years of education: Not on file  . Highest education level: Not on file  Occupational History  . Not on file  Tobacco Use  . Smoking status: Never Smoker  . Smokeless tobacco: Never Used  Vaping Use  . Vaping Use: Never used  Substance and Sexual Activity  . Alcohol use: Yes    Alcohol/week: 7.0 standard drinks    Types: 7 Glasses of wine per week    Comment: 1glass of wine with dinner  . Drug use: Never  . Sexual activity: Yes    Partners: Male    Comment: 1st intercourse- 23, partners- 1, married- 3 yrs   Other Topics Concern  . Not on file  Social History Narrative  . Not on file   Social Determinants of Health   Financial Resource Strain: Not on file  Food Insecurity: Not on file  Transportation Needs: Not on file  Physical Activity: Not on file  Stress: Not on file  Social Connections: Not on file  Intimate Partner Violence: Not on file   Patient has no known allergies.    Prenatal Transfer Tool  Maternal Diabetes: No Genetic Screening: Normal Maternal Ultrasounds/Referrals: Normal Fetal Ultrasounds or other Referrals:  None Maternal Substance Abuse:  No Significant Maternal Medications:  None Significant Maternal Lab Results: Group B Strep negative  Other PNC: uncomplicated.    Vitals:   11/05/20 1751 11/05/20 1756 11/05/20 1801 11/05/20 1809  BP: 125/83 123/70  113/68 111/70  Pulse: 85 (!) 128 81 88  Height:        Lungs/Cor:  NAD Abdomen:  soft, gravid Ex:  no cords, erythema SVE:  5.5/100/+1 FHTs:  120s, good STV, NST R; Cat 1 tracing. Toco:  q 1-2   A/P   Term labor.  GBS neg.  Loney Laurence

## 2020-11-05 NOTE — Lactation Note (Signed)
This note was copied from a baby's chart. Lactation Consultation Note Mom holding baby STS. Mom stated she has latched him once but she doesn't know if he got anything. Mom stated she doesn't know if she has anything. Hand expression demonstrated colostrum. Mom happy. Mom has good everted nipples. Assisted in latch. Mom pushing baby to much into breast. Repositioned, breast compressions. Baby taking long suckles. Good breast compressions noted. Mom wanted to know different feeding positions. LC reviewed. Mom wanted to try football. Unlatched baby placed in football position. Mom stated she felt better because she can see his nose and have more control of his head.  Mom will be f/u on MBU.  Patient Name: Courtney Estrada [id number] Date: 11/05/2020 Reason for consult: L&D Initial assessment;Primapara;Term Age:37 hours  Maternal Data Has patient been taught Hand Expression?: Yes Does the patient have breastfeeding experience prior to this delivery?: No  Feeding    LATCH Score Latch: Grasps breast easily, tongue down, lips flanged, rhythmical sucking.  Audible Swallowing: A few with stimulation  Type of Nipple: Everted at rest and after stimulation  Comfort (Breast/Nipple): Soft / non-tender  Hold (Positioning): Assistance needed to correctly position infant at breast and maintain latch.  LATCH Score: 8   Lactation Tools Discussed/Used    Interventions Interventions: Breast feeding basics reviewed;Support pillows;Assisted with latch;Position options;Skin to skin;Breast massage;Hand express;Adjust position;Breast compression  Discharge WIC Program: No  Consult Status Consult Status: Follow-up Date: 11/06/20 Follow-up type: In-patient    Charyl Dancer 11/05/2020, 10:41 PM

## 2020-11-06 LAB — CBC
HCT: 33 % — ABNORMAL LOW (ref 36.0–46.0)
Hemoglobin: 11.5 g/dL — ABNORMAL LOW (ref 12.0–15.0)
MCH: 31.6 pg (ref 26.0–34.0)
MCHC: 34.8 g/dL (ref 30.0–36.0)
MCV: 90.7 fL (ref 80.0–100.0)
Platelets: 202 10*3/uL (ref 150–400)
RBC: 3.64 MIL/uL — ABNORMAL LOW (ref 3.87–5.11)
RDW: 13 % (ref 11.5–15.5)
WBC: 14 10*3/uL — ABNORMAL HIGH (ref 4.0–10.5)
nRBC: 0 % (ref 0.0–0.2)

## 2020-11-06 LAB — RPR: RPR Ser Ql: NONREACTIVE

## 2020-11-06 MED ORDER — FERROUS SULFATE 325 (65 FE) MG PO TABS
325.0000 mg | ORAL_TABLET | Freq: Two times a day (BID) | ORAL | Status: DC
  Filled 2020-11-06: qty 1

## 2020-11-06 MED ORDER — METHYLERGONOVINE MALEATE 0.2 MG PO TABS: 0.2000 mg | ORAL_TABLET | ORAL | Status: DC | PRN

## 2020-11-06 MED ORDER — MAGNESIUM HYDROXIDE 400 MG/5ML PO SUSP: 30.0000 mL | ORAL | Status: DC | PRN

## 2020-11-06 MED ORDER — MEASLES, MUMPS & RUBELLA VAC IJ SOLR: 0.5000 mL | Freq: Once | INTRAMUSCULAR | Status: DC

## 2020-11-06 MED ORDER — SODIUM CHLORIDE 0.9% FLUSH: 3.0000 mL | INTRAVENOUS | Status: DC | PRN

## 2020-11-06 MED ORDER — ZOLPIDEM TARTRATE 5 MG PO TABS: 5.0000 mg | ORAL_TABLET | Freq: Every evening | ORAL | Status: DC | PRN

## 2020-11-06 MED ORDER — SODIUM CHLORIDE 0.9 % IV SOLN: 250.0000 mL | INTRAVENOUS | Status: DC | PRN

## 2020-11-06 MED ORDER — BENZOCAINE-MENTHOL 20-0.5 % EX AERO
1.0000 "application " | INHALATION_SPRAY | CUTANEOUS | Status: DC | PRN
  Administered 2020-11-06: 1 via TOPICAL
  Filled 2020-11-06 (×2): qty 56

## 2020-11-06 MED ORDER — ONDANSETRON HCL 4 MG PO TABS: 4.0000 mg | ORAL_TABLET | ORAL | Status: DC | PRN

## 2020-11-06 MED ORDER — METHYLERGONOVINE MALEATE 0.2 MG/ML IJ SOLN: 0.2000 mg | INTRAMUSCULAR | Status: DC | PRN

## 2020-11-06 MED ORDER — ACETAMINOPHEN 325 MG PO TABS
650.0000 mg | ORAL_TABLET | ORAL | Status: DC | PRN
  Administered 2020-11-06 – 2020-11-07 (×5): 650 mg via ORAL
  Filled 2020-11-06 (×5): qty 2

## 2020-11-06 MED ORDER — SIMETHICONE 80 MG PO CHEW: 80.0000 mg | CHEWABLE_TABLET | ORAL | Status: DC | PRN

## 2020-11-06 MED ORDER — DIBUCAINE (PERIANAL) 1 % EX OINT: 1.0000 "application " | TOPICAL_OINTMENT | CUTANEOUS | Status: DC | PRN

## 2020-11-06 MED ORDER — TETANUS-DIPHTH-ACELL PERTUSSIS 5-2.5-18.5 LF-MCG/0.5 IM SUSY: 0.5000 mL | PREFILLED_SYRINGE | Freq: Once | INTRAMUSCULAR | Status: DC

## 2020-11-06 MED ORDER — WITCH HAZEL-GLYCERIN EX PADS: 1.0000 "application " | MEDICATED_PAD | CUTANEOUS | Status: DC | PRN

## 2020-11-06 MED ORDER — OXYCODONE-ACETAMINOPHEN 5-325 MG PO TABS
2.0000 | ORAL_TABLET | ORAL | Status: DC | PRN
Start: 2020-11-06 — End: 2020-11-07

## 2020-11-06 MED ORDER — COCONUT OIL OIL
1.0000 "application " | TOPICAL_OIL | Status: DC | PRN
  Administered 2020-11-06: 1 via TOPICAL

## 2020-11-06 MED ORDER — SODIUM CHLORIDE 0.9% FLUSH: 3.0000 mL | Freq: Two times a day (BID) | INTRAVENOUS | Status: DC

## 2020-11-06 MED ORDER — NAPROXEN 250 MG PO TABS
500.0000 mg | ORAL_TABLET | Freq: Two times a day (BID) | ORAL | Status: DC
  Administered 2020-11-06 – 2020-11-07 (×3): 500 mg via ORAL
  Filled 2020-11-06 (×4): qty 2

## 2020-11-06 MED ORDER — IBUPROFEN 800 MG PO TABS: 800.0000 mg | ORAL_TABLET | Freq: Three times a day (TID) | ORAL | Status: DC

## 2020-11-06 MED ORDER — DIPHENHYDRAMINE HCL 25 MG PO CAPS: 25.0000 mg | ORAL_CAPSULE | Freq: Four times a day (QID) | ORAL | Status: DC | PRN

## 2020-11-06 MED ORDER — PRENATAL MULTIVITAMIN CH
1.0000 | ORAL_TABLET | Freq: Every day | ORAL | Status: DC
  Administered 2020-11-06: 1 via ORAL
  Filled 2020-11-06: qty 1

## 2020-11-06 MED ORDER — SENNOSIDES-DOCUSATE SODIUM 8.6-50 MG PO TABS
2.0000 | ORAL_TABLET | Freq: Every day | ORAL | Status: DC
  Administered 2020-11-06: 2 via ORAL
  Filled 2020-11-06 (×2): qty 2

## 2020-11-06 MED ORDER — ONDANSETRON HCL 4 MG/2ML IJ SOLN: 4.0000 mg | INTRAMUSCULAR | Status: DC | PRN

## 2020-11-06 NOTE — Progress Notes (Signed)
Patient is doing well.  She is ambulating, voiding, tolerating PO.  Pain control is good.  Lochia is appropriate  Vitals:   11/05/20 2328 11/05/20 2352 11/06/20 0050 11/06/20 0450  BP: 119/80 123/83 110/78 106/66  Pulse: 82 90 95 96  Resp:  18 16 16   Temp: 98.3 F (36.8 C) 97.6 F (36.4 C) 98.6 F (37 C) 98.1 F (36.7 C)  TempSrc: Oral Oral Oral Oral  SpO2:  100% 98% 97%  Height:        NAD Fundus firm Ext: no edema  Lab Results  Component Value Date   WBC 14.0 (H) 11/06/2020   HGB 11.5 (L) 11/06/2020   HCT 33.0 (L) 11/06/2020   MCV 90.7 11/06/2020   PLT 202 11/06/2020    --/--/O POS (04/11 1705)/RImmune  A/P 29 y.o. G1P1001 PPD#1. Routine care.   Desires circ--awaiting clearance by nursery--baby with poor feeding overnight and no exam / H&P yet Tressie Ragin GEFFEL Dashayla Theissen

## 2020-11-06 NOTE — Progress Notes (Signed)
I have reviewed and concur with this student's documentation.   

## 2020-11-06 NOTE — Anesthesia Postprocedure Evaluation (Signed)
Anesthesia Post Note  Patient: Courtney Estrada  Procedure(s) Performed: AN AD HOC LABOR EPIDURAL     Patient location during evaluation: Mother Baby Anesthesia Type: Epidural Level of consciousness: awake and alert Pain management: pain level controlled Vital Signs Assessment: post-procedure vital signs reviewed and stable Respiratory status: spontaneous breathing, nonlabored ventilation and respiratory function stable Cardiovascular status: stable Postop Assessment: no headache, no backache and epidural receding Anesthetic complications: no   No complications documented.  Last Vitals:  Vitals:   11/06/20 0748 11/06/20 0755  BP: 101/77 101/77  Pulse: 98 98  Resp: 18 18  Temp: 37.1 C 37.1 C  SpO2: 98% 98%    Last Pain:  Vitals:   11/06/20 0801  TempSrc:   PainSc: 3    Pain Goal: Patients Stated Pain Goal: 1 (11/05/20 1719)                 Larna Capelle

## 2020-11-06 NOTE — Lactation Note (Signed)
This note was copied from a baby's chart. Lactation Consultation Note  Patient Name: Courtney Estrada IRWER'X Date: 11/06/2020 Reason for consult: Follow-up assessment;Term;Primapara;1st time breastfeeding Age:31 hours  Visited with mom of 15 hours old FT female, she's a P1. Parents told LC that baby just had his bath, baby asleep and fully dressed with a long sleeve onesie. LC updated the last two feedings parents reported but they also asked if they could latch baby while LC was in the room.  LC tried to wake baby up, but baby would not even open his mouth when taken to the right breast in cross cradle position. LC assisted mom with hand expression and colostrum flows so easily, LC showed parents how to burp baby and how to finger feed baby. An attempt was documented in flowsheets.  Reviewed normal newborn behavior, feeding cues, cluster feeding, size of baby's stomach and feeding patters during the first days of life.   Feeding plan:  1. Encouraged mom to feed baby STS 8-12 times/24 hours or sooner if feeding cues are present 2. Hand expression and finger/spoon feeding were also encouraged  BF brochure, BF resources and feeding diary were reviewed. FOB and mom's twin (she's an RD @ [employer]) present and very supportive. Family reported all questions and concerns were answered, they're all aware of LC OP services and will call PRN.  Maternal Data    Feeding Mother's Current Feeding Choice: Breast Milk  LATCH Score                    Lactation Tools Discussed/Used Tools: Coconut oil  Interventions Interventions: Breast feeding basics reviewed;Assisted with latch;Breast massage;Hand express;Breast compression;Coconut oil  Discharge Pump: Personal (Spectra DEBP at home)  Consult Status Consult Status: Follow-up Date: 11/07/20 Follow-up type: In-patient    Jessicca Stitzer Venetia Constable 11/06/2020, 12:58 PM

## 2020-11-06 NOTE — Lactation Note (Signed)
This note was copied from a baby's chart. Lactation Consultation Note Attempted to see mom. Mom sleeping but woke up when LC entered rm. Mom stated she has tried several times to feed baby but he wasn't interested.  Encouraged mom to call for assistance as needed for next feeding. Lactation brochure given.  Patient Name: Boy Terria Deschepper TRRNH'A Date: 11/06/2020   Age:31 hours  Maternal Data    Feeding    LATCH Score                    Lactation Tools Discussed/Used    Interventions    Discharge    Consult Status      Charyl Dancer 11/06/2020, 5:08 AM

## 2020-11-07 NOTE — Lactation Note (Signed)
This note was copied from a baby's chart. Lactation Consultation Note Baby 32 hrs old.  Attempted to consult w/mom. Room dark. Mom woke up when [facility] opened door. Asked mom how BF was going, she stated fine. Asked if baby had a void or stool in the night mom stated 9:50pm. Baby had 7% weight loss but has had a large out put. Was very spitty the first 24 hrs. Not a good time to consult w/mom d/t sleepy.  Patient Name: Courtney Estrada [id number] Date: 11/07/2020   Age:54 hours  Maternal Data    Feeding    LATCH Score                    Lactation Tools Discussed/Used    Interventions    Discharge    Consult Status      Charyl Dancer 11/07/2020, 6:03 AM

## 2020-11-07 NOTE — Lactation Note (Signed)
This note was copied from a baby's chart. Lactation Consultation Note  Patient Name: Courtney Estrada [id number]'O Date: 11/07/2020 Reason for consult: Follow-up assessment Age:31 hours  Follow up with 38 hours old infant with 7.17% weight loss at the time of this visit. Mother reports breastfeeding is going well. Infant had a circumcision earlier and has been sleepy since. Parents report clusterfeeding overnight.   Mother mentioned some nipple sensitivity. LC provided comfort gels and encouraged mother to use EBM and air-dry prior to comfort gels use.   Reviewed normal newborn behavior and cluster feeding. Reviewed Lactation Services brochure and encouraged to call for support. Praised parents for their efforts and dedication.   Discharge Plan: 1. Breastfeed following hunger cues or 8 - 12 times in 24h period   2. Ensure infant has a deep latch and/or chin tugging to improve latch. 3. Pump or hand-express and offer EBM as needed to "top up" infant 4. Lots of skin to skin  5. Encouraged maternal rest, hydration and food intake.  6. Contact Lactation Services or local resources for support, questions or concerns.     Maternal Data Has patient been taught Hand Expression?: Yes Does the patient have breastfeeding experience prior to this delivery?: No  Feeding Mother's Current Feeding Choice: Breast Milk  Interventions Interventions: Breast feeding basics reviewed;Skin to skin;Hand express;Hand pump;Breast compression;Coconut oil;Comfort gels;Expressed milk;Education  Discharge Discharge Education: Engorgement and breast care;Warning signs for feeding baby Pump: Personal;Manual WIC Program: No  Consult Status Consult Status: Complete Date: 11/07/20 Follow-up type: Call as needed    Jadine Brumley A Higuera Ancidey 11/07/2020, 11:57 AM

## 2020-11-07 NOTE — Discharge Summary (Signed)
Postpartum Discharge Summary    Patient Name: Courtney Estrada DOB: 1990-08-05 MRN: [id number]  Date of admission: 11/05/2020 Delivery date:11/05/2020  Delivering provider: Bobbye Charleston  Date of discharge: 11/07/2020  Admitting diagnosis: Normal labor [O80, Z37.9] Intrauterine pregnancy: [redacted]w[redacted]d    Secondary diagnosis:  Active Problems:   Normal labor  Additional problems: none    Discharge diagnosis: Term Pregnancy Delivered                                              Post partum procedures:none Augmentation: N/A Complications: None  Hospital course: Onset of Labor With Vaginal Delivery      30y.o. yo G1P1001 at 473w2das admitted in Active Labor on 11/05/2020. Patient had an uncomplicated labor course as follows:  Membrane Rupture Time/Date: 4:00 PM ,11/05/2020   Delivery Method:Vaginal, Spontaneous  Episiotomy: None  Lacerations:  2nd degree  Patient had an uncomplicated postpartum course.  She is ambulating, tolerating a regular diet, passing flatus, and urinating well. Patient is discharged home in stable condition on 11/07/20.  Newborn Data: Birth date:11/05/2020  Birth time:9:21 PM  Gender:Female  Living status:Living  Apgars:9 ,9  Weight:3291 g   Magnesium Sulfate received: No BMZ received: No Rhophylac:N/A MMR:N/A T-DaP:see office note Flu: N/A Transfusion:No  Physical exam  Vitals:   11/06/20 1140 11/06/20 1409 11/06/20 2200 11/07/20 0430  BP: 111/81 120/84 102/64 103/64  Pulse: 94 100 98 76  Resp: 18 18 17 18   Temp: 98.2 F (36.8 C) 98.4 F (36.9 C) 97.9 F (36.6 C) 98.1 F (36.7 C)  TempSrc: Oral Oral Oral Oral  SpO2: 99% 100% 98% 100%  Height:       General: alert, cooperative and no distress Lochia: appropriate Uterine Fundus: firm DVT Evaluation: No evidence of DVT seen on physical exam. Labs: Lab Results  Component Value Date   WBC 14.0 (H) 11/06/2020   HGB 11.5 (L) 11/06/2020   HCT 33.0 (L) 11/06/2020   MCV 90.7 11/06/2020    PLT 202 11/06/2020   No flowsheet data found. Edinburgh Score: Edinburgh Postnatal Depression Scale Screening Tool 11/05/2020  I have been able to laugh and see the funny side of things. 0  I have looked forward with enjoyment to things. 0  I have blamed myself unnecessarily when things went wrong. 0  I have been anxious or worried for no good reason. 1  I have felt scared or panicky for no good reason. 0  Things have been getting on top of me. 0  I have been so unhappy that I have had difficulty sleeping. 0  I have felt sad or miserable. 1  I have been so unhappy that I have been crying. 1  The thought of harming myself has occurred to me. 0  Edinburgh Postnatal Depression Scale Total 3      After visit meds:  Allergies as of 11/07/2020   No Known Allergies     Medication List    TAKE these medications   lactase 3000 units tablet Commonly known as: LACTAID Take 6,000 Units by mouth 3 (three) times daily with meals.   prenatal multivitamin Tabs tablet Take 1 tablet by mouth daily at 12 noon.        Discharge home in stable condition Infant Feeding: Breast Infant Disposition:home with mother Discharge instruction: per After Visit Summary and Postpartum  booklet. Activity: Advance as tolerated. Pelvic rest for 6 weeks.  Diet: routine diet Anticipated Birth Control: Unsure Postpartum Appointment:4 weeks Additional Postpartum F/U: Postpartum Depression checkup Future Appointments: Future Appointments  Date Time Provider [place]  11/12/2020 10:45 AM [facility] None   Follow up Visit:  Follow-up Information    Bobbye Charleston, MD Follow up in 4 week(s).   Specialty: Obstetrics and Gynecology Contact information: [address] [place] [address] [phone]                   11/07/2020 Allyn Kenner, DO

## 2020-11-12 ENCOUNTER — Other Ambulatory Visit (HOSPITAL_COMMUNITY): Payer: Commercial Managed Care - PPO

## 2020-11-13 ENCOUNTER — Inpatient Hospital Stay (HOSPITAL_COMMUNITY): Payer: Commercial Managed Care - PPO

## 2020-11-13 ENCOUNTER — Inpatient Hospital Stay (HOSPITAL_COMMUNITY)
Admission: AD | Admit: 2020-11-13 | Payer: Commercial Managed Care - PPO | Source: Home / Self Care | Admitting: Obstetrics & Gynecology

## 2021-08-24 ENCOUNTER — Other Ambulatory Visit: Payer: Self-pay | Admitting: Obstetrics and Gynecology

## 2021-08-24 DIAGNOSIS — N6320 Unspecified lump in the left breast, unspecified quadrant: Secondary | ICD-10-CM

## 2021-09-18 ENCOUNTER — Ambulatory Visit
Admission: RE | Admit: 2021-09-18 | Discharge: 2021-09-18 | Disposition: A | Discharge status: Home / Self Care | Payer: Commercial Managed Care - PPO | Source: Ambulatory Visit | Attending: Obstetrics and Gynecology | Admitting: Obstetrics and Gynecology

## 2021-09-18 ENCOUNTER — Ambulatory Visit
Admission: RE | Admit: 2021-09-18 | Discharge: 2021-09-18 | Disposition: A | Discharge status: Home / Self Care | Payer: BC Managed Care – PPO | Source: Ambulatory Visit | Attending: Obstetrics and Gynecology | Admitting: Obstetrics and Gynecology

## 2021-09-18 DIAGNOSIS — N6320 Unspecified lump in the left breast, unspecified quadrant: Secondary | ICD-10-CM

## 2022-04-12 IMAGING — MG DIGITAL DIAGNOSTIC BILAT W/ TOMO W/ CAD
6 of 12 series · 6 of 36 positions shown · non-contrast
Comparison: None.

CLINICAL DATA: 30-year-old female who stopped breast feeding
approximately 1 week ago. Patient states that she mostly breast fed
from the right breast because the left breast did not produce milk.
She complains of diffuse lumpiness throughout the left breast.

EXAM:
DIGITAL DIAGNOSTIC BILATERAL MAMMOGRAM WITH TOMOSYNTHESIS AND CAD;
ULTRASOUND LEFT BREAST LIMITED
TECHNIQUE: Bilateral digital diagnostic mammography and breast tomosynthesis
was performed. The images were evaluated with computer-aided
detection.; Targeted ultrasound examination of the left breast was
performed.

[R MLO synth-2D]
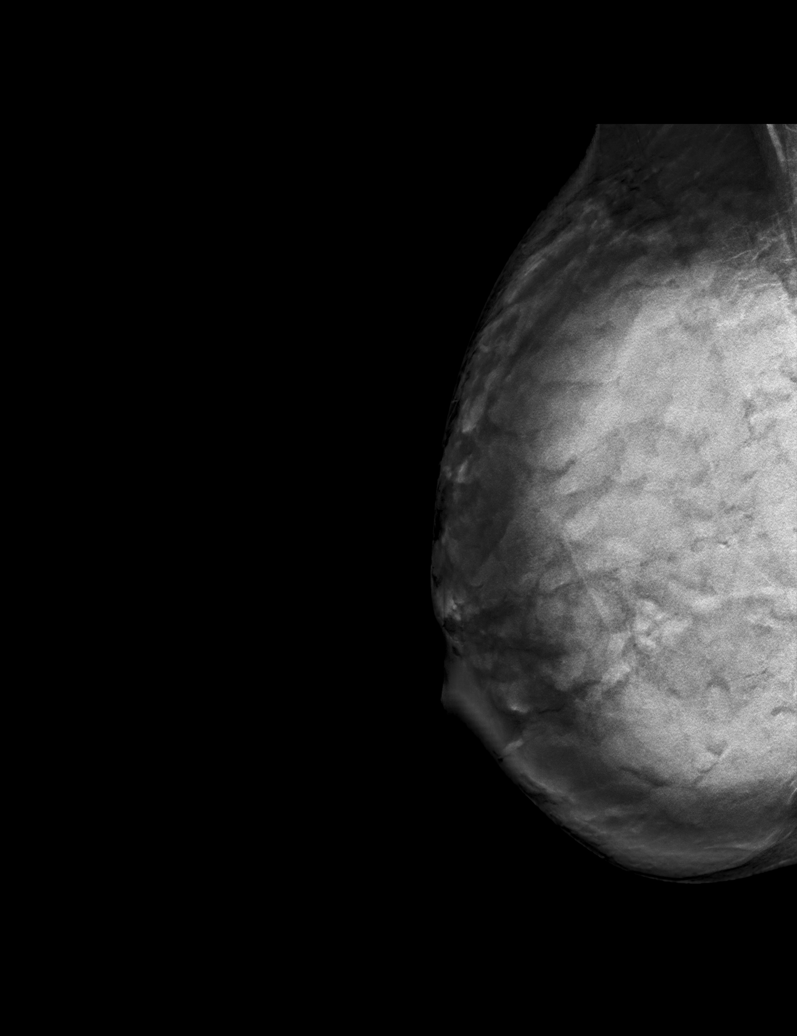

[R CC synth-2D]
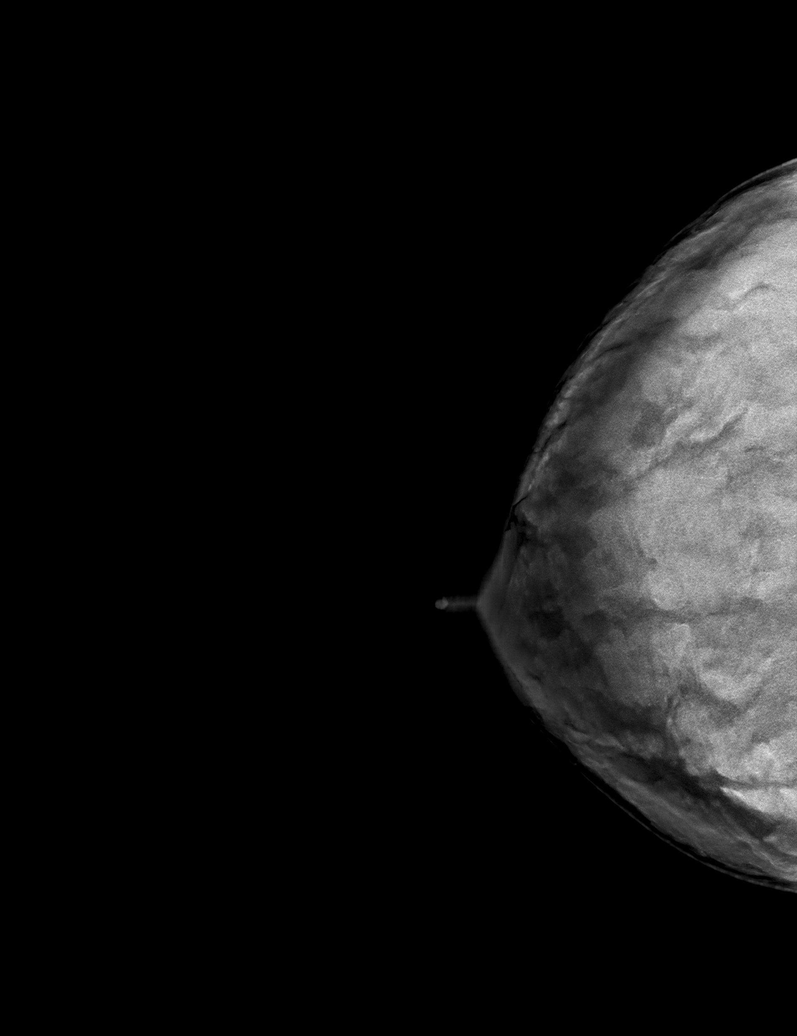

[L TAN synth-2D (1 of 2)]
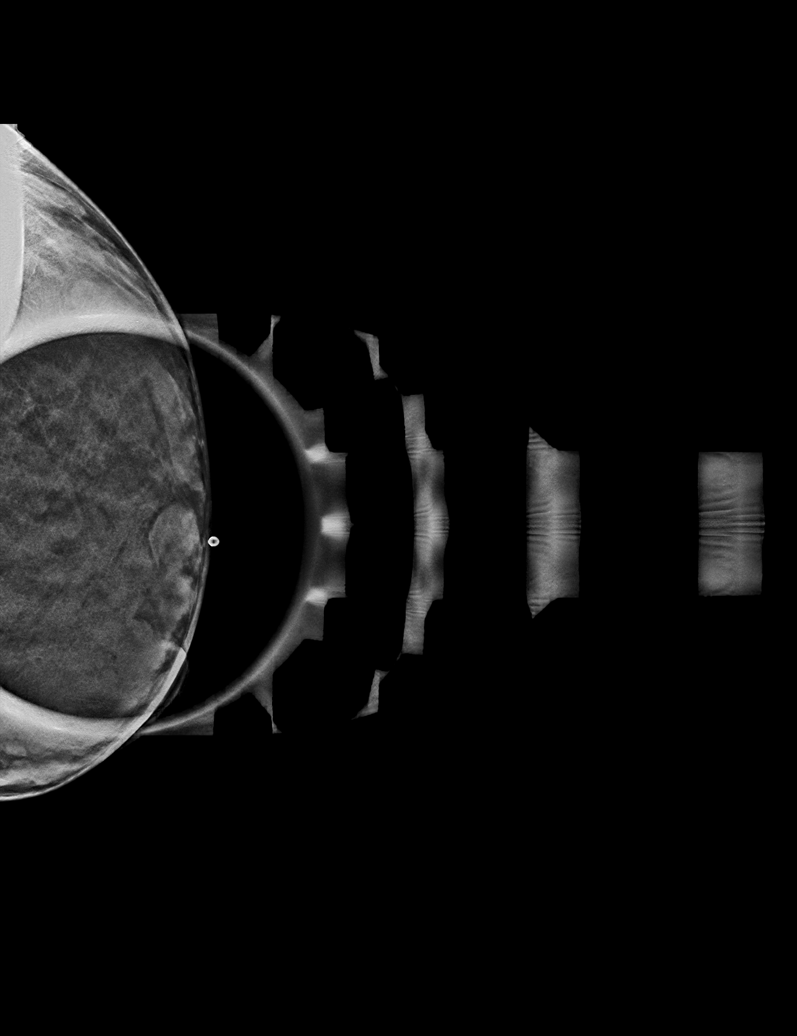

[L CC synth-2D]
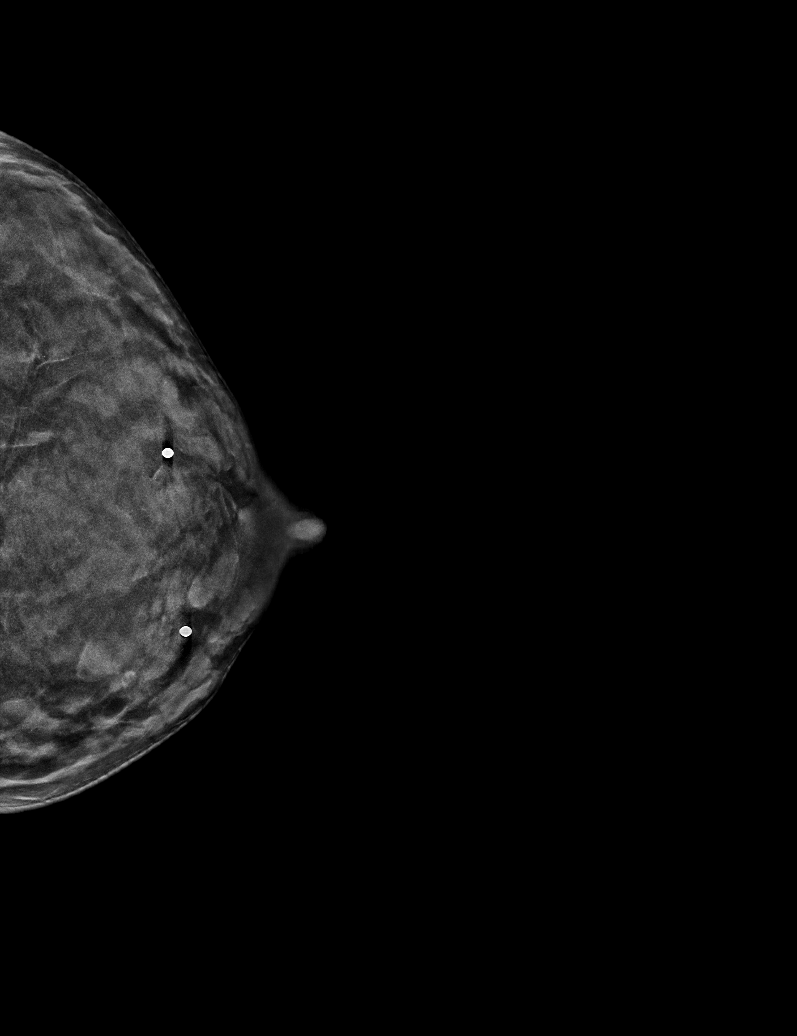

[L MLO synth-2D]
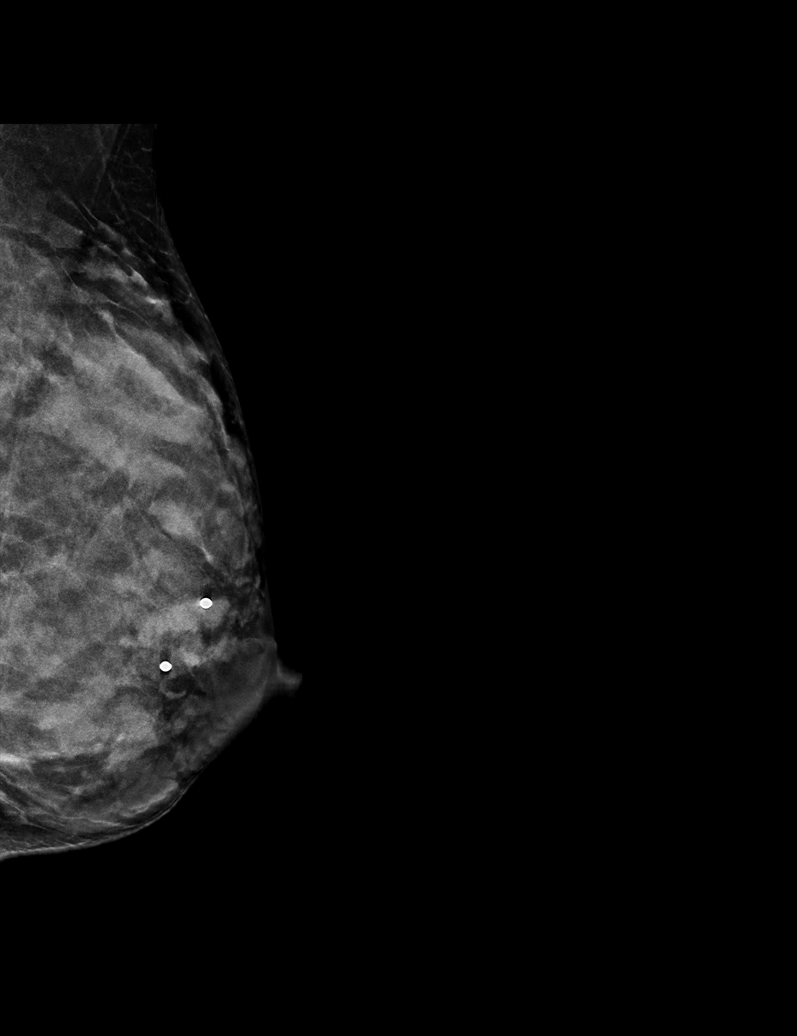

[L TAN synth-2D (2 of 2)]
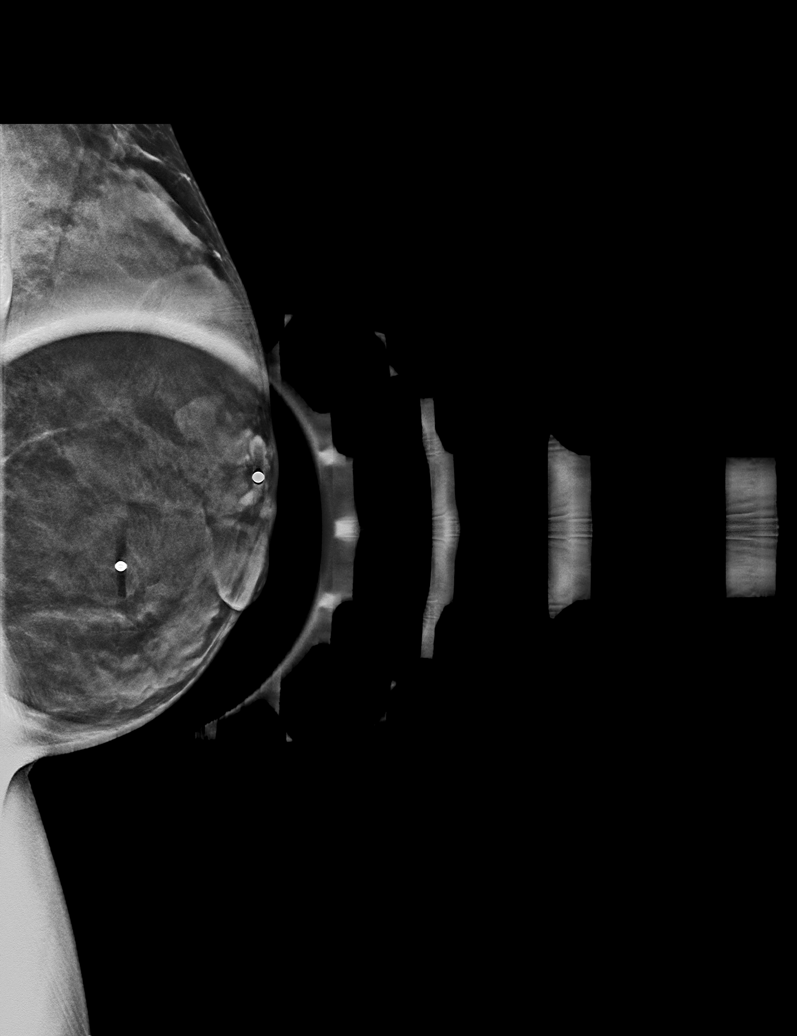

[6 of 36 positions shown; findings below may reference images not displayed]

ACR Breast Density Category d: The breast tissue is extremely dense,
which lowers the sensitivity of mammography.
FINDINGS: No suspicious mass or malignant type microcalcifications identified
in either breast.

On physical exam, I palpate diffuse nodularity throughout the left
breast with no focal discrete mass.

Targeted ultrasound is performed, showing normal tissue throughout
the entire left breast. No discrete mass identified.
IMPRESSION: No evidence of malignancy in either breast.

RECOMMENDATION:
If the clinical exam remains benign/stable screening mammography can
be deferred until the age of 40.

I have discussed the findings and recommendations with the patient.
If applicable, a reminder letter will be sent to the patient
regarding the next appointment.

BI-RADS CATEGORY  1: Negative.

## 2023-11-17 ENCOUNTER — Other Ambulatory Visit: Payer: Self-pay | Admitting: Obstetrics and Gynecology

## 2023-11-17 DIAGNOSIS — Z8669 Personal history of other diseases of the nervous system and sense organs: Secondary | ICD-10-CM

## 2023-11-18 ENCOUNTER — Encounter: Payer: Self-pay | Admitting: Obstetrics and Gynecology

## 2023-11-24 ENCOUNTER — Ambulatory Visit
Admission: RE | Admit: 2023-11-24 | Discharge: 2023-11-24 | Disposition: A | Discharge status: Home / Self Care | Payer: Self-pay | Source: Ambulatory Visit | Attending: Obstetrics and Gynecology

## 2023-11-24 DIAGNOSIS — Z8669 Personal history of other diseases of the nervous system and sense organs: Secondary | ICD-10-CM

## 2024-04-08 ENCOUNTER — Encounter (HOSPITAL_COMMUNITY): Payer: Self-pay | Admitting: Obstetrics and Gynecology

## 2024-04-08 ENCOUNTER — Other Ambulatory Visit: Payer: Self-pay

## 2024-04-08 ENCOUNTER — Inpatient Hospital Stay (HOSPITAL_COMMUNITY)

## 2024-04-08 ENCOUNTER — Inpatient Hospital Stay (HOSPITAL_COMMUNITY)
Admission: AD | Admit: 2024-04-08 | Discharge: 2024-04-08 | Disposition: A | Discharge status: Home / Self Care | Attending: Obstetrics and Gynecology | Admitting: Obstetrics and Gynecology

## 2024-04-08 DIAGNOSIS — R109 Unspecified abdominal pain: Secondary | ICD-10-CM | POA: Diagnosis present

## 2024-04-08 DIAGNOSIS — O26891 Other specified pregnancy related conditions, first trimester: Secondary | ICD-10-CM | POA: Diagnosis not present

## 2024-04-08 DIAGNOSIS — I8290 Acute embolism and thrombosis of unspecified vein: Secondary | ICD-10-CM

## 2024-04-08 DIAGNOSIS — Z3A01 Less than 8 weeks gestation of pregnancy: Secondary | ICD-10-CM

## 2024-04-08 DIAGNOSIS — Z3490 Encounter for supervision of normal pregnancy, unspecified, unspecified trimester: Secondary | ICD-10-CM

## 2024-04-08 MED ORDER — ENOXAPARIN SODIUM 60 MG/0.6ML IJ SOSY
50.0000 mg | PREFILLED_SYRINGE | Freq: Two times a day (BID) | INTRAMUSCULAR | Status: DC
  Administered 2024-04-08: 50 mg via SUBCUTANEOUS
  Filled 2024-04-08: qty 0.6

## 2024-04-08 MED ORDER — ENOXAPARIN SODIUM 60 MG/0.6ML IJ SOSY: 1.0000 mg/kg | PREFILLED_SYRINGE | Freq: Two times a day (BID) | INTRAMUSCULAR | 0 refills | Status: AC

## 2024-04-08 NOTE — MAU Note (Signed)
 Courtney Estrada is a 33 y.o. at Unknown here in MAU reporting: sent from [facility] [place] office secondary blood clot seen on ovary during ultrasound.  Denies abdominal pain and VB.  LMP: 02/18/2024 Onset of complaint: today Pain score: 0 Vitals:   04/08/24 1155  BP: 105/65  Pulse: 87  Resp: 20  Temp: 98.2 F (36.8 C)  SpO2: 100%     FHT: NA  Lab orders placed from triage: None

## 2024-04-08 NOTE — MAU Note (Signed)
 Not in lobby when called to get dicahrge papers

## 2024-04-08 NOTE — Discharge Instructions (Signed)
 Dr. Diedre will call to speak to you later on this evening. Please make sure you are available to receive her call

## 2024-04-08 NOTE — MAU Provider Note (Signed)
 History     CSN: [id number]  Arrival date and time: 04/08/24 1131   Event Date/Time   First Provider Initiated Contact with Patient 04/08/24 1459      Chief Complaint  Patient presents with   Pregnancy Ultrasound   HPI Ms. Courtney Estrada is a 33 y.o. year old [id number] female at [redacted]w[redacted]d weeks gestation who was sent to MAU from her OB office reporting she needs a repeat U/S. Her U/S in the office showed a viable IUP and ?blood clot separate from her LT ovary. Dr. Marinone is following this patient and wanted to be contacted when work-up is complete. She receives [facility] with [facility]; next appt is 3 weeks from today. Her spouse is present and contributing to the history taking.   OB History     Gravida  2   Para  1   Term  1   Preterm  0   AB  0   Living  1      SAB  0   IAB  0   Ectopic  0   Multiple  0   Live Births  1           Past Medical History:  Diagnosis Date   Medical history non-contributory     Past Surgical History:  Procedure Laterality Date   NO PAST SURGERIES      Family History  Problem Relation Age of Onset   Cancer Father        prostate   Breast cancer Maternal Grandmother    Diabetes Maternal Grandmother    Cancer Maternal Grandfather        lung   Breast cancer Paternal Grandmother     Social History   Tobacco Use   Smoking status: Never   Smokeless tobacco: Never  Vaping Use   Vaping status: Never Used  Substance Use Topics   Alcohol use: Yes    Alcohol/week: 7.0 standard drinks of alcohol    Types: 7 Glasses of wine per week    Comment: 1glass of wine with dinner   Drug use: Never    Allergies: No Known Allergies  Medications Prior to Admission  Medication Sig Dispense Refill Last Dose/Taking   lactase (LACTAID) 3000 units tablet Take 6,000 Units by mouth 3 (three) times daily with meals.      Prenatal Vit-Fe Fumarate-FA (PRENATAL MULTIVITAMIN) TABS tablet Take 1 tablet by mouth daily at 12 noon.        Review of Systems  Constitutional: Negative.   HENT: Negative.    Eyes: Negative.   Respiratory: Negative.    Cardiovascular: Negative.   Gastrointestinal: Negative.   Endocrine: Negative.   Genitourinary: Negative.   Musculoskeletal: Negative.   Skin: Negative.   Allergic/Immunologic: Negative.   Neurological: Negative.   Hematological: Negative.   Psychiatric/Behavioral: Negative.     Physical Exam   Blood pressure 105/65, pulse 87, temperature 98.2 F (36.8 C), temperature source Oral, resp. rate 20, height 5' 6 (1.676 m), weight 52.4 kg, last menstrual period 02/18/2024, SpO2 100%, unknown if currently breastfeeding.  Physical Exam Vitals and nursing note reviewed.  Constitutional:      Appearance: Normal appearance. She is normal weight.  Cardiovascular:     Rate and Rhythm: Normal rate.  Pulmonary:     Effort: Pulmonary effort is normal.  Musculoskeletal:        General: Normal range of motion.  Neurological:     Mental Status: She is alert and oriented  to person, place, and time.  Psychiatric:        Mood and Affect: Mood is anxious. Affect is tearful.        Behavior: Behavior normal.        Thought Content: Thought content normal.        Judgment: Judgment normal.    MAU Course  Procedures  MDM *Consult with Dr. Diedre @ [phone] - notified of patient's complaints, assessments, & U/S results, recommended tx plan Dr. Diedre will call the IR doctors to consult with them on tx plan  TC from Dr. Diedre @ [phone] -- IR MD advised that no tx is necessary, but anticoagulation tx would be ok. She explained that IR MD stated that nothing bad would happen from the location of the thrombus. It would be more concerning if the thrombus was located on the RT side that opens up into the IVC; the LT side opens up into the kidneys. Rx Lovenox  1 mg/kg q 12 hrs; ok to give 1st dose here.- ok to d/c home, agrees with plan  Lovenox  1 mg/kg 1st dose here  US  OB LESS THAN 14  WEEKS WITH OB TRANSVAGINAL Result Date: 04/08/2024 CLINICAL DATA:  [id number] Abdominal pain during pregnancy in first trimester [id number] EXAM: OBSTETRIC <14 WK US  AND TRANSVAGINAL OB US  TECHNIQUE: Both transabdominal and transvaginal ultrasound examinations were performed for complete evaluation of the gestation as well as the maternal uterus, adnexal regions, and pelvic cul-de-sac. Transvaginal technique was performed to assess early pregnancy. COMPARISON:  03/19/2020 FINDINGS: Intrauterine gestational sac: Single Yolk sac:  Present Fetal Pole:  Present Cardiac Activity: Present Heart Rate: 146 bpm CRL: 13.5 mm 7w 5d                US  EDC: 11/18/2024 Subchorionic hemorrhage:  None visualized. Maternal uterus/adnexae: Thick-walled cystic structure in the right ovary measuring 2.5 cm, consistent with the corpus luteum. Nonenlarged left ovary. Trace free fluid in the pelvis. In the left adnexa adjacent to the left ovary, there is a mildly dilated vessel, containing echogenic material, but with vascular Doppler flow present. IMPRESSION: 1. Single, live intrauterine gestation with an estimated gestational age of [redacted] weeks, 5 days. Fetal heart rate of 146 beats per minute. Continued routine obstetric and sonographic follow-up is recommended. 2. 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 These results will be called to the ordering clinician or representative by the Radiologist Assistant and communication documented in the PACS or [employer]. Electronically Signed   By: Rogelia Myers M.D.   On: 04/08/2024 14:16   Assessment and Plan  1. Non-occlusive thrombus (Primary) - Prescription for: Lovenox  1 mg/kg every 12 hours until seen in the office in 3 weeks by Dr. Diedre - Information provided on Lovenox  injections - RN demonstrated injection technique to  patient and her spouse   2. Intrauterine pregnancy - Reviewed U/S with patient and spouse  3. [redacted] weeks gestation of pregnancy   - Discharge home - Keep scheduled appt with Dr. Diedre in 3 weeks - Patient verbalized an understanding of the plan of care and agrees.   Ala Cart, CNM 04/08/2024, 2:59 PM

## 2024-04-08 NOTE — MAU Note (Signed)
 Pt came back . Discharge paper given and pt went home

## 2024-04-27 ENCOUNTER — Encounter: Payer: Self-pay | Admitting: Obstetrics and Gynecology

## 2024-04-27 DIAGNOSIS — I82890 Acute embolism and thrombosis of other specified veins: Secondary | ICD-10-CM

## 2024-04-29 ENCOUNTER — Telehealth: Payer: Self-pay

## 2024-04-29 NOTE — Telephone Encounter (Signed)
 Spoke with patient and confirmed appointment on 10/6

## 2024-05-02 ENCOUNTER — Encounter: Payer: Self-pay | Admitting: Hematology and Oncology

## 2024-05-02 ENCOUNTER — Inpatient Hospital Stay: Attending: Hematology and Oncology | Admitting: Hematology and Oncology

## 2024-05-02 ENCOUNTER — Inpatient Hospital Stay

## 2024-05-02 VITALS — BP 102/62 | HR 83 | Temp 98.2°F | Resp 16 | Wt 116.6 lb

## 2024-05-02 DIAGNOSIS — Z3A11 11 weeks gestation of pregnancy: Secondary | ICD-10-CM | POA: Insufficient documentation

## 2024-05-02 DIAGNOSIS — O26892 Other specified pregnancy related conditions, second trimester: Secondary | ICD-10-CM | POA: Insufficient documentation

## 2024-05-02 DIAGNOSIS — O2231 Deep phlebothrombosis in pregnancy, first trimester: Secondary | ICD-10-CM

## 2024-05-02 DIAGNOSIS — I82890 Acute embolism and thrombosis of other specified veins: Secondary | ICD-10-CM | POA: Insufficient documentation

## 2024-05-02 DIAGNOSIS — O210 Mild hyperemesis gravidarum: Secondary | ICD-10-CM | POA: Diagnosis not present

## 2024-05-02 LAB — ANTITHROMBIN III: AntiThromb III Func: 94 % (ref 75–120)

## 2024-05-02 NOTE — Progress Notes (Unsigned)
 [facility] CONSULT NOTE  Patient Care Team: Okey Carlin Redbird, MD as PCP - General  CHIEF COMPLAINTS/PURPOSE OF CONSULTATION:  Gonadal vein thrombus  ASSESSMENT & PLAN:    Orders Placed This Encounter  Procedures   Lupus anticoagulant panel    Standing Status:   Future    Number of Occurrences:   1    Expected Date:   05/02/2024    Expiration Date:   05/02/2025   Prothrombin gene mutation    Standing Status:   Future    Number of Occurrences:   1    Expected Date:   05/02/2024    Expiration Date:   05/02/2025   Factor 5 leiden    Standing Status:   Future    Number of Occurrences:   1    Expected Date:   05/02/2024    Expiration Date:   05/02/2025   Hexagonal Phospholipid Neutralization    Standing Status:   Future    Number of Occurrences:   1    Expected Date:   05/02/2024    Expiration Date:   05/02/2025   Cardiolipin antibodies, IgG, IgM, IgA    Standing Status:   Future    Number of Occurrences:   1    Expected Date:   05/02/2024    Expiration Date:   05/02/2025   Antithrombin III    Standing Status:   Future    Number of Occurrences:   1    Expected Date:   05/02/2024    Expiration Date:   05/02/2025   Protein C activity    Standing Status:   Future    Number of Occurrences:   1    Expected Date:   05/02/2024    Expiration Date:   05/02/2025   Protein S activity    Standing Status:   Future    Number of Occurrences:   1    Expected Date:   05/02/2024    Expiration Date:   05/02/2025   Beta-2-glycoprotein i abs, IgG/M/A    Standing Status:   Future    Number of Occurrences:   1    Expected Date:   05/02/2024    Expiration Date:   05/02/2025   Assessment and Plan Assessment & Plan 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 Likely pregnancy-related, but hypercoagulable state not excluded. - Continue enoxaparin  1 mg/kg twice daily. - Pt would like to proceed with hypercoagulable work  up. - We discussed that we ideally would recommend doing it in the post partum because some coagulation factors are measured low but she says post partum is already a stressful time, and she would like to prepare mentally if she needs long term anticoagulation. - She also mentions that her twin sister who is pregnant currently may benefit if she indeed does have a hypercoagulable state - We discussed that the factor levels that are abnormally low should be ideally repeated post partum before we make a conclusive recommendations about anticoagulation - At this time she should continue anticoagulation through out pregnancy and immediate post partum, ideally about 6 weeks. - We discussed increased risk of bleeding while on blood thinners which rarely may affect the fetus as well, - I encouraged her to discuss with her ob about the epidural anesthesia and anticoagulation recommendations around epidural anaesthesia. - We will do a telephone visit to review lab results and I would like to see her back post partum.  HISTORY OF PRESENTING ILLNESS:  Courtney Estrada 33 y.o. female is here because of acute gonadal vein thrombus.  Discussed the use of AI scribe software for clinical note transcription with the patient, who gave verbal consent to proceed.  History of Present Illness Courtney Estrada is a 33 year old female who presents with a history of a resolved gonadal vein thrombosis during pregnancy. She is accompanied by her twin sister, who is also pregnant.  She is currently [redacted] weeks pregnant and was diagnosed with a gonadal vein thrombosis at 7 weeks of gestation. The thrombosis was initially discovered during an OB ultrasound at [facility], leading to further evaluation at [facility] where the diagnosis was confirmed. Subsequent ultrasounds have shown that the clot has resolved, although blood flow remains sluggish.  She was started on enoxaparin  (Lovenox ) at the time of diagnosis, with  a dosage adjusted to 0.525 mL twice daily based on her weight.  She experiences pain on her left side when lying down, which she initially attributed to IBS or gas-related issues. There is a concern about whether the thrombosis is pregnancy-related or due to a pre-existing hypercoagulable state, as she has no prior history of thrombosis.  Her past medical history includes IBS, lactose intolerance, and migraines. She has no history of surgeries and her first pregnancy resulted in a vaginal delivery. She was previously on Depo-Provera  and Minerva IUD for birth control.  She is currently taking prenatal vitamins and Diclegis  for morning sickness. She does not smoke or consume alcohol during pregnancy.  All other systems were reviewed with the patient and are negative.  MEDICAL HISTORY:  Past Medical History:  Diagnosis Date   Medical history non-contributory     SURGICAL HISTORY: Past Surgical History:  Procedure Laterality Date   NO PAST SURGERIES      SOCIAL HISTORY: Social History   Socioeconomic History   Marital status: Married    Spouse name: Not on file   Number of children: Not on file   Years of education: Not on file   Highest education level: Not on file  Occupational History   Not on file  Tobacco Use   Smoking status: Never   Smokeless tobacco: Never  Vaping Use   Vaping status: Never Used  Substance and Sexual Activity   Alcohol use: Yes    Alcohol/week: 7.0 standard drinks of alcohol    Types: 7 Glasses of wine per week    Comment: 1glass of wine with dinner   Drug use: Never   Sexual activity: Yes    Partners: Male    Comment: 1st intercourse- 70, partners- 1, married- 3 yrs   Other Topics Concern   Not on file  Social History Narrative   Not on file   Social Drivers of Health   Financial Resource Strain: Not on file  Food Insecurity: No Food Insecurity (05/02/2024)   Hunger Vital Sign    Worried About Running Out of Food in the Last Year: Never  true    Ran Out of Food in the Last Year: Never true  Transportation Needs: No Transportation Needs (05/02/2024)   PRAPARE - Administrator, Civil Service (Medical): No    Lack of Transportation (Non-Medical): No  Physical Activity: Not on file  Stress: Not on file  Social Connections: Unknown (12/09/2021)   Received from [facility]   Social Network    Social Network: Not on file  Intimate Partner Violence: Not At Risk (05/02/2024)  Humiliation, Afraid, Rape, and Kick questionnaire    Fear of Current or Ex-Partner: No    Emotionally Abused: No    Physically Abused: No    Sexually Abused: No    FAMILY HISTORY: Family History  Problem Relation Age of Onset   Cancer Father        prostate   Breast cancer Maternal Grandmother    Diabetes Maternal Grandmother    Cancer Maternal Grandfather        lung   Breast cancer Paternal Grandmother     ALLERGIES:  has no known allergies.  MEDICATIONS:  Current Outpatient Medications  Medication Sig Dispense Refill   enoxaparin  (LOVENOX ) 60 MG/0.6ML injection Inject 0.525 mLs (52.5 mg total) into the skin every 12 (twelve) hours for 21 days. 22.05 mL 0   lactase (LACTAID) 3000 units tablet Take 6,000 Units by mouth 3 (three) times daily with meals.     Prenatal Vit-Fe Fumarate-FA (PRENATAL MULTIVITAMIN) TABS tablet Take 1 tablet by mouth daily at 12 noon.     No current facility-administered medications for this visit.     PHYSICAL EXAMINATION: ECOG PERFORMANCE STATUS: 0 - Asymptomatic  Vitals:   05/02/24 1209  BP: 102/62  Pulse: 83  Resp: 16  Temp: 98.2 F (36.8 C)  SpO2: 96%   Filed Weights   05/02/24 1209  Weight: 116 lb 9.6 oz (52.9 kg)    GENERAL:alert, no distress and comfortable, appears wel SKIN: skin color, texture, turgor are normal, LYMPH:  no palpable lymphadenopathy in the cervical, axillary  LUNGS: clear to auscultation and percussion with normal breathing effort HEART: regular rate & rhythm  and no murmurs and no lower extremity edema ABDOMEN:abdomen soft, non-tender and normal bowel sounds Musculoskeletal:no cyanosis of digits and no clubbing  PSYCH: alert & oriented x 3 with fluent speech NEURO: no focal motor/sensory deficits  LABORATORY DATA:  I have reviewed the data as listed Lab Results  Component Value Date   WBC 14.0 (H) 11/06/2020   HGB 11.5 (L) 11/06/2020   HCT 33.0 (L) 11/06/2020   MCV 90.7 11/06/2020   PLT 202 11/06/2020     Chemistry   No results found for: NA, K, CL, CO2, BUN, CREATININE, GLU No results found for: CALCIUM, ALKPHOS, AST, ALT, BILITOT     RADIOGRAPHIC STUDIES: I have personally reviewed the radiological images as listed and agreed with the findings in the report. US  OB LESS THAN 14 WEEKS WITH OB TRANSVAGINAL Result Date: 04/08/2024 CLINICAL DATA:  [id number] Abdominal pain during pregnancy in first trimester [id number] EXAM: OBSTETRIC <14 WK US  AND TRANSVAGINAL OB US  TECHNIQUE: Both transabdominal and transvaginal ultrasound examinations were performed for complete evaluation of the gestation as well as the maternal uterus, adnexal regions, and pelvic cul-de-sac. Transvaginal technique was performed to assess early pregnancy. COMPARISON:  03/19/2020 FINDINGS: Intrauterine gestational sac: Single Yolk sac:  Present Fetal Pole:  Present Cardiac Activity: Present Heart Rate: 146 bpm CRL: 13.5 mm 7w 5d                US  EDC: 11/18/2024 Subchorionic hemorrhage:  None visualized. Maternal uterus/adnexae: Thick-walled cystic structure in the right ovary measuring 2.5 cm, consistent with the corpus luteum. Nonenlarged left ovary. Trace free fluid in the pelvis. In the left adnexa adjacent to the left ovary, there is a mildly dilated vessel, containing echogenic material, but with vascular Doppler flow present. IMPRESSION: 1. Single, live intrauterine gestation with an estimated gestational age of [redacted] weeks, 5 days. Fetal heart rate  of 146  beats per minute. Continued routine obstetric and sonographic follow-up is recommended. 2. 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 These results will be called to the ordering clinician or representative by the Radiologist Assistant and communication documented in the PACS or [employer]. Electronically Signed   By: Rogelia Myers M.D.   On: 04/08/2024 14:16    All questions were answered. The patient knows to call the clinic with any problems, questions or concerns. I spent 45 minutes in the care of this patient including H and P, review of records, counseling and coordination of care.     Amber Stalls, MD 05/04/2024 7:41 AM

## 2024-05-03 LAB — LUPUS ANTICOAGULANT PANEL
DRVVT: 28.1 s (ref 0.0–47.0)
PTT Lupus Anticoagulant: 37 s (ref 0.0–43.5)

## 2024-05-03 LAB — PROTEIN S ACTIVITY: Protein S Activity: 44 % — ABNORMAL LOW (ref 63–140)

## 2024-05-03 LAB — PROTEIN C ACTIVITY: Protein C Activity: 101 % (ref 73–180)

## 2024-05-05 LAB — CARDIOLIPIN ANTIBODIES, IGG, IGM, IGA
Anticardiolipin IgA: <9 U/mL (ref 0–11)
Anticardiolipin IgG: <9 GPL U/mL (ref 0–14)
Anticardiolipin IgM: <9 [MPL'U]/mL (ref 0–12)

## 2024-05-05 LAB — FACTOR 5 LEIDEN

## 2024-05-06 LAB — PROTHROMBIN GENE MUTATION

## 2024-05-06 LAB — BETA-2-GLYCOPROTEIN I ABS, IGG/M/A
Beta-2 Glyco I IgG: <9 GPI IgG units (ref 0–20)
Beta-2-Glycoprotein I IgA: <9 GPI IgA units (ref 0–25)
Beta-2-Glycoprotein I IgM: <9 GPI IgM units (ref 0–32)

## 2024-05-11 LAB — HEXAGONAL PHOSPHOLIPID NEUTRALIZATION: Hexagonal Phospholipid Neutral: 3 s

## 2024-05-16 ENCOUNTER — Inpatient Hospital Stay: Admitting: Hematology and Oncology

## 2024-05-16 DIAGNOSIS — O2231 Deep phlebothrombosis in pregnancy, first trimester: Secondary | ICD-10-CM

## 2024-05-16 DIAGNOSIS — Z86718 Personal history of other venous thrombosis and embolism: Secondary | ICD-10-CM

## 2024-05-16 NOTE — Progress Notes (Signed)
 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 Likely pregnancy-related, but hypercoagulable state not excluded. - Continue enoxaparin  1 mg/kg twice daily. - Hypercoag work up neg except for low protein S activitiy, this is likely from pregnancy - We have discussed about repeating them in a couple months after delivery. She will return as scheduled - She had questions about excessive bruising she noticed. Encouraged to have her antifactor xa levels done at her ob's office. - She will discuss with them.  HISTORY OF PRESENTING ILLNESS:  Courtney Estrada 33 y.o. female is here because of acute gonadal vein thrombus.  Discussed the use of AI scribe software for clinical note transcription with the patient, who gave verbal consent to proceed.  History of Present Illness Courtney Estrada is a 33 year old female who presents with a history of a resolved gonadal vein thrombosis during pregnancy.   She is here for a telephone visit. She denies any complaints today except for excessive bruising around the injection site.  All other systems were reviewed with the patient and are negative.  MEDICAL HISTORY:  Past Medical History:  Diagnosis Date   Medical history non-contributory     SURGICAL HISTORY: Past Surgical History:  Procedure Laterality Date   NO PAST SURGERIES      SOCIAL HISTORY: Social History   Socioeconomic History   Marital status: Married    Spouse name: Not on file   Number of children: Not on file   Years of education: Not on file   Highest education level: Not on file  Occupational History   Not on file  Tobacco Use   Smoking status: Never   Smokeless tobacco: Never  Vaping Use    Vaping status: Never Used  Substance and Sexual Activity   Alcohol use: Yes    Alcohol/week: 7.0 standard drinks of alcohol    Types: 7 Glasses of wine per week    Comment: 1glass of wine with dinner   Drug use: Never   Sexual activity: Yes    Partners: Male    Comment: 1st intercourse- 61, partners- 1, married- 3 yrs   Other Topics Concern   Not on file  Social History Narrative   Not on file   Social Drivers of Health   Financial Resource Strain: Not on file  Food Insecurity: No Food Insecurity (05/02/2024)   Hunger Vital Sign    Worried About Running Out of Food in the Last Year: Never true    Ran Out of Food in the Last Year: Never true  Transportation Needs: No Transportation Needs (05/02/2024)   PRAPARE - Administrator, Civil Service (Medical): No    Lack of Transportation (Non-Medical): No  Physical Activity: Not on file  Stress: Not on file  Social Connections: Unknown (12/09/2021)   Received from [facility]   Social Network    Social Network: Not on file  Intimate Partner Violence: Not At Risk (05/02/2024)   Humiliation, Afraid, Rape, and Kick questionnaire    Fear of Current or Ex-Partner: No    Emotionally Abused: No    Physically Abused: No    Sexually Abused: No    FAMILY HISTORY: Family History  Problem Relation Age of Onset   Cancer Father  prostate   Breast cancer Maternal Grandmother    Diabetes Maternal Grandmother    Cancer Maternal Grandfather        lung   Breast cancer Paternal Grandmother     ALLERGIES:  has no known allergies.  MEDICATIONS:  Current Outpatient Medications  Medication Sig Dispense Refill   enoxaparin  (LOVENOX ) 60 MG/0.6ML injection Inject 0.525 mLs (52.5 mg total) into the skin every 12 (twelve) hours for 21 days. 22.05 mL 0   lactase (LACTAID) 3000 units tablet Take 6,000 Units by mouth 3 (three) times daily with meals.     Prenatal Vit-Fe Fumarate-FA (PRENATAL MULTIVITAMIN) TABS tablet Take 1 tablet  by mouth daily at 12 noon.     No current facility-administered medications for this visit.     PHYSICAL EXAMINATION: ECOG PERFORMANCE STATUS: 0 - Asymptomatic  There were no vitals filed for this visit.  There were no vitals filed for this visit.  Telephone visit  LABORATORY DATA:  I have reviewed the data as listed Lab Results  Component Value Date   WBC 14.0 (H) 11/06/2020   HGB 11.5 (L) 11/06/2020   HCT 33.0 (L) 11/06/2020   MCV 90.7 11/06/2020   PLT 202 11/06/2020     Chemistry   No results found for: NA, K, CL, CO2, BUN, CREATININE, GLU No results found for: CALCIUM, ALKPHOS, AST, ALT, BILITOT     RADIOGRAPHIC STUDIES: I have personally reviewed the radiological images as listed and agreed with the findings in the report. No results found.   All questions were answered. The patient knows to call the clinic with any problems, questions or concerns. I spent 10 minutes in the care of this patient including H and P, review of records, counseling and coordination of care.  I connected with  Dawna JAYSON Perch on 05/16/24 by a telephone application and verified that I am speaking with the correct person using two identifiers.   I discussed the limitations of evaluation and management by telemedicine. The patient expressed understanding and agreed to proceed.  Location of pt: Home Location of provider: office   Amber Stalls, MD 05/16/2024 10:34 AM

## 2024-08-29 ENCOUNTER — Ambulatory Visit

## 2024-10-04 ENCOUNTER — Ambulatory Visit

## 2025-01-20 ENCOUNTER — Inpatient Hospital Stay

## 2025-01-31 ENCOUNTER — Inpatient Hospital Stay: Admitting: Hematology and Oncology
